# Patient Record
Sex: Female | Born: 1935 | Race: White | Hispanic: No | Smoking: Never smoker
Health system: Southern US, Community
[De-identification: ages and names within clinical notes are randomized; demographics above are authoritative.]

## PROBLEM LIST (undated history)

## (undated) DIAGNOSIS — E785 Hyperlipidemia, unspecified: Secondary | ICD-10-CM

## (undated) HISTORY — PX: HEMORRHOID SURGERY: SHX153

---

## 2020-04-14 ENCOUNTER — Inpatient Hospital Stay (HOSPITAL_COMMUNITY)
Admission: AD | Admit: 2020-04-14 | Discharge: 2020-04-19 | DRG: 668 | Disposition: A | Discharge status: Home Health | Payer: Medicare Other | Source: Other Acute Inpatient Hospital | Attending: Student | Admitting: Student

## 2020-04-14 DIAGNOSIS — Z8249 Family history of ischemic heart disease and other diseases of the circulatory system: Secondary | ICD-10-CM

## 2020-04-14 DIAGNOSIS — R7989 Other specified abnormal findings of blood chemistry: Secondary | ICD-10-CM | POA: Diagnosis not present

## 2020-04-14 DIAGNOSIS — D75839 Thrombocytosis, unspecified: Secondary | ICD-10-CM | POA: Diagnosis not present

## 2020-04-14 DIAGNOSIS — R319 Hematuria, unspecified: Secondary | ICD-10-CM | POA: Diagnosis present

## 2020-04-14 DIAGNOSIS — Z20822 Contact with and (suspected) exposure to covid-19: Secondary | ICD-10-CM | POA: Diagnosis not present

## 2020-04-14 DIAGNOSIS — Z515 Encounter for palliative care: Secondary | ICD-10-CM | POA: Diagnosis not present

## 2020-04-14 DIAGNOSIS — N261 Atrophy of kidney (terminal): Secondary | ICD-10-CM | POA: Diagnosis present

## 2020-04-14 DIAGNOSIS — D72829 Elevated white blood cell count, unspecified: Secondary | ICD-10-CM | POA: Diagnosis present

## 2020-04-14 DIAGNOSIS — R5381 Other malaise: Secondary | ICD-10-CM | POA: Diagnosis not present

## 2020-04-14 DIAGNOSIS — D62 Acute posthemorrhagic anemia: Secondary | ICD-10-CM | POA: Diagnosis present

## 2020-04-14 DIAGNOSIS — N131 Hydronephrosis with ureteral stricture, not elsewhere classified: Secondary | ICD-10-CM | POA: Diagnosis not present

## 2020-04-14 DIAGNOSIS — I5031 Acute diastolic (congestive) heart failure: Secondary | ICD-10-CM | POA: Diagnosis not present

## 2020-04-14 DIAGNOSIS — N179 Acute kidney failure, unspecified: Secondary | ICD-10-CM | POA: Diagnosis present

## 2020-04-14 DIAGNOSIS — R059 Cough, unspecified: Secondary | ICD-10-CM

## 2020-04-14 DIAGNOSIS — N3289 Other specified disorders of bladder: Secondary | ICD-10-CM | POA: Diagnosis present

## 2020-04-14 DIAGNOSIS — E785 Hyperlipidemia, unspecified: Secondary | ICD-10-CM | POA: Diagnosis present

## 2020-04-14 DIAGNOSIS — E876 Hypokalemia: Secondary | ICD-10-CM | POA: Diagnosis not present

## 2020-04-14 DIAGNOSIS — C679 Malignant neoplasm of bladder, unspecified: Principal | ICD-10-CM | POA: Diagnosis present

## 2020-04-14 DIAGNOSIS — R310 Gross hematuria: Secondary | ICD-10-CM

## 2020-04-14 HISTORY — DX: Hyperlipidemia, unspecified: E78.5

## 2020-04-15 ENCOUNTER — Other Ambulatory Visit: Payer: Self-pay

## 2020-04-15 ENCOUNTER — Observation Stay (HOSPITAL_COMMUNITY): Payer: Medicare Other | Admitting: Anesthesiology

## 2020-04-15 ENCOUNTER — Encounter (HOSPITAL_COMMUNITY)
Admission: AD | Disposition: A | Discharge status: Home Health | Payer: Self-pay | Source: Other Acute Inpatient Hospital | Attending: Student

## 2020-04-15 ENCOUNTER — Encounter (HOSPITAL_COMMUNITY): Payer: Self-pay | Admitting: Internal Medicine

## 2020-04-15 ENCOUNTER — Observation Stay (HOSPITAL_COMMUNITY): Payer: Medicare Other

## 2020-04-15 DIAGNOSIS — N3289 Other specified disorders of bladder: Secondary | ICD-10-CM | POA: Diagnosis not present

## 2020-04-15 DIAGNOSIS — I5032 Chronic diastolic (congestive) heart failure: Secondary | ICD-10-CM | POA: Diagnosis not present

## 2020-04-15 DIAGNOSIS — D494 Neoplasm of unspecified behavior of bladder: Secondary | ICD-10-CM | POA: Diagnosis not present

## 2020-04-15 DIAGNOSIS — N131 Hydronephrosis with ureteral stricture, not elsewhere classified: Secondary | ICD-10-CM | POA: Diagnosis present

## 2020-04-15 DIAGNOSIS — D62 Acute posthemorrhagic anemia: Secondary | ICD-10-CM | POA: Diagnosis present

## 2020-04-15 DIAGNOSIS — R531 Weakness: Secondary | ICD-10-CM | POA: Diagnosis not present

## 2020-04-15 DIAGNOSIS — Z20822 Contact with and (suspected) exposure to covid-19: Secondary | ICD-10-CM | POA: Diagnosis present

## 2020-04-15 DIAGNOSIS — D72829 Elevated white blood cell count, unspecified: Secondary | ICD-10-CM | POA: Diagnosis present

## 2020-04-15 DIAGNOSIS — R059 Cough, unspecified: Secondary | ICD-10-CM

## 2020-04-15 DIAGNOSIS — R319 Hematuria, unspecified: Secondary | ICD-10-CM | POA: Diagnosis present

## 2020-04-15 DIAGNOSIS — N261 Atrophy of kidney (terminal): Secondary | ICD-10-CM | POA: Diagnosis present

## 2020-04-15 DIAGNOSIS — Z7189 Other specified counseling: Secondary | ICD-10-CM | POA: Diagnosis not present

## 2020-04-15 DIAGNOSIS — I5023 Acute on chronic systolic (congestive) heart failure: Secondary | ICD-10-CM | POA: Diagnosis not present

## 2020-04-15 DIAGNOSIS — C679 Malignant neoplasm of bladder, unspecified: Secondary | ICD-10-CM | POA: Diagnosis present

## 2020-04-15 DIAGNOSIS — N179 Acute kidney failure, unspecified: Secondary | ICD-10-CM

## 2020-04-15 DIAGNOSIS — R5381 Other malaise: Secondary | ICD-10-CM | POA: Diagnosis present

## 2020-04-15 DIAGNOSIS — D75839 Thrombocytosis, unspecified: Secondary | ICD-10-CM | POA: Diagnosis present

## 2020-04-15 DIAGNOSIS — I5031 Acute diastolic (congestive) heart failure: Secondary | ICD-10-CM | POA: Diagnosis present

## 2020-04-15 DIAGNOSIS — I509 Heart failure, unspecified: Secondary | ICD-10-CM

## 2020-04-15 DIAGNOSIS — R7989 Other specified abnormal findings of blood chemistry: Secondary | ICD-10-CM | POA: Diagnosis present

## 2020-04-15 DIAGNOSIS — E785 Hyperlipidemia, unspecified: Secondary | ICD-10-CM | POA: Diagnosis present

## 2020-04-15 DIAGNOSIS — R310 Gross hematuria: Secondary | ICD-10-CM

## 2020-04-15 DIAGNOSIS — E876 Hypokalemia: Secondary | ICD-10-CM | POA: Diagnosis not present

## 2020-04-15 DIAGNOSIS — Z515 Encounter for palliative care: Secondary | ICD-10-CM | POA: Diagnosis not present

## 2020-04-15 DIAGNOSIS — Z8249 Family history of ischemic heart disease and other diseases of the circulatory system: Secondary | ICD-10-CM | POA: Diagnosis not present

## 2020-04-15 HISTORY — PX: TRANSURETHRAL RESECTION OF BLADDER TUMOR: SHX2575

## 2020-04-15 LAB — BRAIN NATRIURETIC PEPTIDE: B Natriuretic Peptide: 833.2 pg/mL — ABNORMAL HIGH (ref 0.0–100.0)

## 2020-04-15 LAB — COMPREHENSIVE METABOLIC PANEL
ALT: 187 U/L — ABNORMAL HIGH (ref 0–44)
ALT: 171 U/L — ABNORMAL HIGH (ref 0–44)
AST: 214 U/L — ABNORMAL HIGH (ref 15–41)
AST: 170 U/L — ABNORMAL HIGH (ref 15–41)
Albumin: 2.8 g/dL — ABNORMAL LOW (ref 3.5–5.0)
Albumin: 2.8 g/dL — ABNORMAL LOW (ref 3.5–5.0)
Alkaline Phosphatase: 60 U/L (ref 38–126)
Alkaline Phosphatase: 55 U/L (ref 38–126)
Anion gap: 7 (ref 5–15)
Anion gap: 5 (ref 5–15)
BUN: 25 mg/dL — ABNORMAL HIGH (ref 8–23)
BUN: 23 mg/dL (ref 8–23)
CO2: 22 mmol/L (ref 22–32)
CO2: 23 mmol/L (ref 22–32)
Calcium: 8.5 mg/dL — ABNORMAL LOW (ref 8.9–10.3)
Calcium: 8 mg/dL — ABNORMAL LOW (ref 8.9–10.3)
Chloride: 113 mmol/L — ABNORMAL HIGH (ref 98–111)
Chloride: 112 mmol/L — ABNORMAL HIGH (ref 98–111)
Creatinine, Ser: 1.51 mg/dL — ABNORMAL HIGH (ref 0.44–1.00)
Creatinine, Ser: 1.31 mg/dL — ABNORMAL HIGH (ref 0.44–1.00)
GFR, Estimated: 34 mL/min — ABNORMAL LOW (ref >60)
GFR, Estimated: 40 mL/min — ABNORMAL LOW (ref >60)
Glucose, Bld: 120 mg/dL — ABNORMAL HIGH (ref 70–99)
Glucose, Bld: 106 mg/dL — ABNORMAL HIGH (ref 70–99)
Potassium: 4.2 mmol/L (ref 3.5–5.1)
Potassium: 4.2 mmol/L (ref 3.5–5.1)
Sodium: 142 mmol/L (ref 135–145)
Sodium: 140 mmol/L (ref 135–145)
Total Bilirubin: 1.2 mg/dL (ref 0.3–1.2)
Total Bilirubin: 1.1 mg/dL (ref 0.3–1.2)
Total Protein: 5.9 g/dL — ABNORMAL LOW (ref 6.5–8.1)
Total Protein: 5.5 g/dL — ABNORMAL LOW (ref 6.5–8.1)

## 2020-04-15 LAB — PROTIME-INR
INR: 1.2 (ref 0.8–1.2)
Prothrombin Time: 14.6 seconds (ref 11.4–15.2)

## 2020-04-15 LAB — CBC
HCT: 24.4 % — ABNORMAL LOW (ref 36.0–46.0)
HCT: 24.7 % — ABNORMAL LOW (ref 36.0–46.0)
Hemoglobin: 7.5 g/dL — ABNORMAL LOW (ref 12.0–15.0)
Hemoglobin: 7.5 g/dL — ABNORMAL LOW (ref 12.0–15.0)
MCH: 25.8 pg — ABNORMAL LOW (ref 26.0–34.0)
MCH: 25.8 pg — ABNORMAL LOW (ref 26.0–34.0)
MCHC: 30.7 g/dL (ref 30.0–36.0)
MCHC: 30.4 g/dL (ref 30.0–36.0)
MCV: 83.8 fL (ref 80.0–100.0)
MCV: 84.9 fL (ref 80.0–100.0)
Platelets: 519 10*3/uL — ABNORMAL HIGH (ref 150–400)
Platelets: 498 10*3/uL — ABNORMAL HIGH (ref 150–400)
RBC: 2.91 MIL/uL — ABNORMAL LOW (ref 3.87–5.11)
RBC: 2.91 MIL/uL — ABNORMAL LOW (ref 3.87–5.11)
RDW: 17.4 % — ABNORMAL HIGH (ref 11.5–15.5)
RDW: 17.9 % — ABNORMAL HIGH (ref 11.5–15.5)
WBC: 14.4 10*3/uL — ABNORMAL HIGH (ref 4.0–10.5)
WBC: 15.8 10*3/uL — ABNORMAL HIGH (ref 4.0–10.5)
nRBC: 1 % — ABNORMAL HIGH (ref 0.0–0.2)
nRBC: 0.6 % — ABNORMAL HIGH (ref 0.0–0.2)

## 2020-04-15 LAB — MAGNESIUM: Magnesium: 2 mg/dL (ref 1.7–2.4)

## 2020-04-15 LAB — PROCALCITONIN: Procalcitonin: 0.24 ng/mL

## 2020-04-15 LAB — CK: Total CK: 348 U/L — ABNORMAL HIGH (ref 38–234)

## 2020-04-15 SURGERY — TURBT (TRANSURETHRAL RESECTION OF BLADDER TUMOR)
Anesthesia: General

## 2020-04-15 MED ORDER — CHLORHEXIDINE GLUCONATE 0.12 % MT SOLN
15.0000 mL | Freq: Once | OROMUCOSAL | Status: AC
  Administered 2020-04-15: 15 mL via OROMUCOSAL

## 2020-04-15 MED ORDER — DEXAMETHASONE SODIUM PHOSPHATE 10 MG/ML IJ SOLN
INTRAMUSCULAR | Status: DC | PRN
  Administered 2020-04-15: 5 mg via INTRAVENOUS

## 2020-04-15 MED ORDER — ONDANSETRON HCL 4 MG/2ML IJ SOLN
INTRAMUSCULAR | Status: DC | PRN
  Administered 2020-04-15: 4 mg via INTRAVENOUS

## 2020-04-15 MED ORDER — ONDANSETRON HCL 4 MG/2ML IJ SOLN
4.0000 mg | Freq: Four times a day (QID) | INTRAMUSCULAR | Status: DC | PRN
  Administered 2020-04-15 – 2020-04-19 (×2): 4 mg via INTRAVENOUS
  Filled 2020-04-15 (×2): qty 2

## 2020-04-15 MED ORDER — GUAIFENESIN-DM 100-10 MG/5ML PO SYRP
5.0000 mL | ORAL_SOLUTION | ORAL | Status: DC | PRN
  Administered 2020-04-15: 5 mL via ORAL
  Filled 2020-04-15: qty 10

## 2020-04-15 MED ORDER — CEFAZOLIN SODIUM-DEXTROSE 2-3 GM-%(50ML) IV SOLR
INTRAVENOUS | Status: DC | PRN
  Administered 2020-04-15: 2 g via INTRAVENOUS

## 2020-04-15 MED ORDER — ACETAMINOPHEN 650 MG RE SUPP: 650.0000 mg | Freq: Four times a day (QID) | RECTAL | Status: DC | PRN

## 2020-04-15 MED ORDER — IPRATROPIUM-ALBUTEROL 0.5-2.5 (3) MG/3ML IN SOLN: 3.0000 mL | Freq: Four times a day (QID) | RESPIRATORY_TRACT | Status: DC | PRN

## 2020-04-15 MED ORDER — PROPOFOL 10 MG/ML IV BOLUS
INTRAVENOUS | Status: DC | PRN
  Administered 2020-04-15: 60 mg via INTRAVENOUS

## 2020-04-15 MED ORDER — CEFAZOLIN SODIUM-DEXTROSE 2-4 GM/100ML-% IV SOLN
INTRAVENOUS | Status: AC
  Filled 2020-04-15: qty 100

## 2020-04-15 MED ORDER — SUGAMMADEX SODIUM 200 MG/2ML IV SOLN
INTRAVENOUS | Status: DC | PRN
  Administered 2020-04-15: 200 mg via INTRAVENOUS

## 2020-04-15 MED ORDER — ONDANSETRON HCL 4 MG/2ML IJ SOLN: 4.0000 mg | Freq: Once | INTRAMUSCULAR | Status: DC | PRN

## 2020-04-15 MED ORDER — ONDANSETRON HCL 4 MG PO TABS
4.0000 mg | ORAL_TABLET | Freq: Four times a day (QID) | ORAL | Status: DC | PRN
  Administered 2020-04-18: 4 mg via ORAL
  Filled 2020-04-15: qty 1

## 2020-04-15 MED ORDER — PHENYLEPHRINE 40 MCG/ML (10ML) SYRINGE FOR IV PUSH (FOR BLOOD PRESSURE SUPPORT)
PREFILLED_SYRINGE | INTRAVENOUS | Status: DC | PRN
  Administered 2020-04-15: 80 ug via INTRAVENOUS

## 2020-04-15 MED ORDER — PHENYLEPHRINE 40 MCG/ML (10ML) SYRINGE FOR IV PUSH (FOR BLOOD PRESSURE SUPPORT)
PREFILLED_SYRINGE | INTRAVENOUS | Status: AC
  Filled 2020-04-15: qty 20

## 2020-04-15 MED ORDER — ROCURONIUM BROMIDE 10 MG/ML (PF) SYRINGE
PREFILLED_SYRINGE | INTRAVENOUS | Status: DC | PRN
  Administered 2020-04-15: 50 mg via INTRAVENOUS

## 2020-04-15 MED ORDER — SODIUM CHLORIDE 0.9 % IV SOLN: INTRAVENOUS | Status: DC

## 2020-04-15 MED ORDER — ORAL CARE MOUTH RINSE: 15.0000 mL | Freq: Once | OROMUCOSAL | Status: AC

## 2020-04-15 MED ORDER — FENTANYL CITRATE (PF) 100 MCG/2ML IJ SOLN
INTRAMUSCULAR | Status: AC
  Filled 2020-04-15: qty 2

## 2020-04-15 MED ORDER — CHLORHEXIDINE GLUCONATE CLOTH 2 % EX PADS
6.0000 | MEDICATED_PAD | Freq: Every day | CUTANEOUS | Status: DC
  Administered 2020-04-15 – 2020-04-19 (×5): 6 via TOPICAL

## 2020-04-15 MED ORDER — LIDOCAINE 2% (20 MG/ML) 5 ML SYRINGE
INTRAMUSCULAR | Status: DC | PRN
  Administered 2020-04-15: 80 mg via INTRAVENOUS

## 2020-04-15 MED ORDER — FENTANYL CITRATE (PF) 100 MCG/2ML IJ SOLN
25.0000 ug | INTRAMUSCULAR | Status: DC | PRN
  Administered 2020-04-15 (×2): 50 ug via INTRAVENOUS

## 2020-04-15 MED ORDER — ACETAMINOPHEN 325 MG PO TABS
650.0000 mg | ORAL_TABLET | Freq: Four times a day (QID) | ORAL | Status: DC | PRN
  Administered 2020-04-16 – 2020-04-18 (×7): 650 mg via ORAL
  Filled 2020-04-15 (×7): qty 2

## 2020-04-15 MED ORDER — SODIUM CHLORIDE 0.9 % IR SOLN
Status: DC | PRN
  Administered 2020-04-15: 27000 mL

## 2020-04-15 MED ORDER — FENTANYL CITRATE (PF) 100 MCG/2ML IJ SOLN
INTRAMUSCULAR | Status: DC | PRN
  Administered 2020-04-15 (×2): 50 ug via INTRAVENOUS

## 2020-04-15 MED ORDER — LACTATED RINGERS IV SOLN: INTRAVENOUS | Status: DC

## 2020-04-15 SURGICAL SUPPLY — 19 items
BAG URINE DRAIN 2000ML AR STRL (UROLOGICAL SUPPLIES) ×2 IMPLANT
BAG URO CATCHER STRL LF (MISCELLANEOUS) ×2 IMPLANT
CATH FOLEY 3WAY 30CC 24FR (CATHETERS) ×1
CATH URTH STD 24FR FL 3W 2 (CATHETERS) ×1 IMPLANT
DRAPE FOOT SWITCH (DRAPES) ×2 IMPLANT
ELECT REM PT RETURN 15FT ADLT (MISCELLANEOUS) ×2 IMPLANT
EVACUATOR MICROVAS BLADDER (UROLOGICAL SUPPLIES) IMPLANT
GLOVE SURG ENC TEXT LTX SZ7.5 (GLOVE) ×2 IMPLANT
GOWN STRL REUS W/TWL LRG LVL3 (GOWN DISPOSABLE) ×2 IMPLANT
KIT TURNOVER KIT A (KITS) ×2 IMPLANT
LOOP CUT BIPOLAR 24F LRG (ELECTROSURGICAL) ×2 IMPLANT
MANIFOLD NEPTUNE II (INSTRUMENTS) ×2 IMPLANT
PACK CYSTO (CUSTOM PROCEDURE TRAY) ×2 IMPLANT
PENCIL SMOKE EVACUATOR (MISCELLANEOUS) IMPLANT
SYR 30ML LL (SYRINGE) ×2 IMPLANT
SYR TOOMEY IRRIG 70ML (MISCELLANEOUS) ×2
SYRINGE TOOMEY IRRIG 70ML (MISCELLANEOUS) ×1 IMPLANT
TUBING CONNECTING 10 (TUBING) ×2 IMPLANT
TUBING UROLOGY SET (TUBING) ×2 IMPLANT

## 2020-04-15 NOTE — Consult Note (Signed)
Urology Consult   Physician requesting consult: Zada Finders, MD  Reason for consult: Gross hematuria, bladder wall irregularity, severe right hydronephrosis.   History of Present Illness: Sharon Huff is a 87 y.o. with only significant past medical history of hyperlipidemia who presented to [facility] ED on 04/14/2020 generalized weakness, gross hematuria.  Per chart review, upon arrival in the ED, hemoglobin 3.4.  She was transfused 3 units of packed red blood cells with subsequent hemoglobin at 8.4.  A three-way Foley catheter was placed and she was started on continuous bladder irrigation.  A noncontrast CT A/P demonstrated bladder appropriately decompressed around Foley catheter with some posterior bladder wall changes demonstrating possible bladder mass or debris as well as right hydronephrosis and appear to be an atrophic right kidney.  Patient denies prior history of gross hematuria.  She denies smoking history.  She denies taking anticoagulation.  She denies abdominal pain or flank pain.  She denies a personal history of malignancy and denies a family history of urologic malignancy.  She states she has never seen a urologist or a nephrologist prior.  Past Medical History:  Diagnosis Date  . Hyperlipidemia     Past Surgical History:  Procedure Laterality Date  . [facility] Medications:  Home meds:  No current facility-administered medications on file prior to encounter.   Current Outpatient Medications on File Prior to Encounter  Medication Sig Dispense Refill  . busPIRone (BUSPAR) 7.5 MG tablet Take 7.5 mg by mouth daily.    Marland Kitchen lovastatin (MEVACOR) 10 MG tablet Take 10 mg by mouth daily.    . Vitamin D, Ergocalciferol, (DRISDOL) 1.25 MG (50000 UNIT) CAPS capsule Take 50,000 Units by mouth once a week.       Scheduled Meds: Continuous Infusions: . sodium chloride 75 mL/hr at 04/15/20 0145   PRN Meds:.acetaminophen **OR** acetaminophen,  guaiFENesin-dextromethorphan, ondansetron **OR** ondansetron (ZOFRAN) IV  Allergies:  Allergies  Allergen Reactions  . Other Other (See Comments)    Caramel     Family History  Problem Relation Age of Onset  . Heart disease Mother   . Heart disease Father     Social History:  reports that she has never smoked. She has never used smokeless tobacco. She reports that she does not drink alcohol and does not use drugs.  ROS: A complete review of systems was performed.  All systems are negative except for pertinent findings as noted.  Physical Exam:  Vital signs in last 24 hours: Temp:  [98.4 F (36.9 C)] 98.4 F (36.9 C) (03/31 2353) Pulse Rate:  [81] 81 (03/31 2353) Resp:  [21] 21 (03/31 2353) BP: (143)/(66) 143/66 (03/31 2353) SpO2:  [100 %] 100 % (03/31 2353) Constitutional:  Alert and oriented, No acute distress Cardiovascular: Regular rate and rhythm Respiratory: Normal respiratory effort, Lungs clear bilaterally GI: Abdomen is soft, nontender, nondistended, no abdominal masses GU: No CVA tenderness Neurologic: Grossly intact, no focal deficits Psychiatric: Normal mood and affect  Laboratory Data:  No results for input(s): WBC, HGB, HCT, PLT in the last 72 hours.  No results for input(s): NA, K, CL, GLUCOSE, BUN, CALCIUM, CREATININE in the last 72 hours.  Invalid input(s): CO3   No results found for this or any previous visit (from the past 24 hour(s)). No results found for this or any previous visit (from the past 240 hour(s)).  Renal Function: No results for input(s): CREATININE in the last 168 hours. CrCl cannot be calculated (No  successful lab value found.).  Radiologic Imaging: No results found.  I independently reviewed the above imaging studies.  Impression/Recommendation: 1. Gross hematuria: Patient presented to [facility] ED on 04/14/2020 with gross hematuria and initial hemoglobin of 3.4.  She was transfused 3 units packed red blood cells with  subsequent hemoglobin of 8.4.  Three-way Foley catheter was placed and she was started on continuous bladder irrigation.  CT A/P 04/14/2020 with posterior bladder wall irregularity suggesting possible mass versus debris. 2. Acute blood loss anemia: Initially presented with a hemoglobin of 3.4 on 04/14/2020.  S/p 3 units packed red blood cells with resultant hemoglobin of 8.4. 3. Possible bladder mass: CT A/P 04/14/2020 with posterior bladder wall irregularity suggesting possible underlying bladder mass versus debris. 4. Right hydronephrosis: Seen on CT A/P 04/14/2020 with atrophic appearing right kidney.  Unclear if this is a chronic process.  -Continue Foley catheter to gravity.   -I manually irrigated the catheter with approximately 500 mL normal saline with return of no clot.  Urine is very light pink tinge. -Resume continuous bladder irrigation.  Manually irrigate every 4 hours or as needed for clots or decreased drainage. -Follow-up CBC and BMP.  Transfuse if hemoglobin is less than 7. -I did review CT A/P noncontrast study with posterior bladder wall changes.  Unclear if she has an underlying bladder mass.  She will eventually need evaluation with cystoscopy, bilateral retrograde pyelograms, possible bladder biopsy, possible transurethral resection of bladder tumor. -There is no need for urgent evaluation in the operating room tonight as her hemoglobin is stable, she is hemodynamically appropriate and her urine is only very slight pink tinge with irrigation. -If GFR is appropriate, will obtain CT hematuria protocol. -Following  Matt R. Dedee Liss MD 04/15/2020, 1:55 AM  [facility]  Pager: [phone]   CC: Zada Finders, MD

## 2020-04-15 NOTE — Transfer of Care (Signed)
Immediate Anesthesia Transfer of Care Note  Patient: Sharon Huff  Procedure(s) Performed: TRANSURETHRAL RESECTION OF BLADDER TUMOR (TURBT)  (N/A )  Patient Location: PACU  Anesthesia Type:General  Level of Consciousness: sedated, patient cooperative and responds to stimulation  Airway & Oxygen Therapy: Patient Spontanous Breathing and Patient connected to face mask oxygen  Post-op Assessment: Report given to RN and Post -op Vital signs reviewed and stable  Post vital signs: Reviewed and stable  Last Vitals:  Vitals Value Taken Time  BP 151/65 04/15/20 1630  Temp    Pulse 73 04/15/20 1631  Resp 21 04/15/20 1631  SpO2 97 % 04/15/20 1631  Vitals shown include unvalidated device data.  Last Pain:  Vitals:   04/15/20 1300  TempSrc: Oral  PainSc: 0-No pain         Complications: No complications documented.

## 2020-04-15 NOTE — Anesthesia Postprocedure Evaluation (Signed)
Anesthesia Post Note  Patient: Rosia Syme  Procedure(s) Performed: TRANSURETHRAL RESECTION OF BLADDER TUMOR (TURBT)  (N/A )     Patient location during evaluation: PACU Anesthesia Type: General Level of consciousness: awake and alert and oriented Pain management: pain level controlled Vital Signs Assessment: post-procedure vital signs reviewed and stable Respiratory status: spontaneous breathing, nonlabored ventilation and respiratory function stable Cardiovascular status: blood pressure returned to baseline and stable Postop Assessment: no apparent nausea or vomiting Anesthetic complications: no   No complications documented.  Last Vitals:  Vitals:   04/15/20 1700 04/15/20 1715  BP: (!) 174/66 (!) 141/79  Pulse: 79 69  Resp: 20 15  Temp:  36.8 C  SpO2: 99% 100%    Last Pain:  Vitals:   04/15/20 1715  TempSrc:   PainSc: 0-No pain                 Terrance Lanahan A.

## 2020-04-15 NOTE — Anesthesia Preprocedure Evaluation (Addendum)
Anesthesia Evaluation  Patient identified by MRN, date of birth, ID bandGeneral Assessment Comment:Patient very tired. Mild tachypnea  Reviewed: Allergy & Precautions, NPO status , Patient's Chart, lab work & pertinent test results  Airway Mallampati: II  TM Distance: >3 FB Neck ROM: Full    Dental no notable dental hx.    Pulmonary neg pulmonary ROS,    Pulmonary exam normal breath sounds clear to auscultation       Cardiovascular negative cardio ROS Normal cardiovascular exam Rhythm:Regular Rate:Normal     Neuro/Psych negative neurological ROS  negative psych ROS   GI/Hepatic negative GI ROS, Neg liver ROS,   Endo/Other  negative endocrine ROS  Renal/GU New bladder mass with anemia, and R hydronephritis  negative genitourinary   Musculoskeletal negative musculoskeletal ROS (+)   Abdominal   Peds negative pediatric ROS (+)  Hematology  (+) anemia ,   Anesthesia Other Findings   Reproductive/Obstetrics negative OB ROS                            Anesthesia Physical Anesthesia Plan  ASA: III  Anesthesia Plan: General   Post-op Pain Management:    Induction: Intravenous  PONV Risk Score and Plan: 3 and Ondansetron, Dexamethasone and Treatment may vary due to age or medical condition  Airway Management Planned: LMA  Additional Equipment:   Intra-op Plan:   Post-operative Plan: Extubation in OR  Informed Consent: I have reviewed the patients History and Physical, chart, labs and discussed the procedure including the risks, benefits and alternatives for the proposed anesthesia with the patient or authorized representative who has indicated his/her understanding and acceptance.     Dental advisory given  Plan Discussed with: CRNA and Surgeon  Anesthesia Plan Comments:         Anesthesia Quick Evaluation

## 2020-04-15 NOTE — Progress Notes (Signed)
PROGRESS NOTE  Sharon Huff ZOX:[id number] DOB: 1934-01-02   PCP: System, Provider Not In  Patient is from: Home  DOA: 04/14/2020 LOS: 1  Chief complaints: Hematuria and anemia  Brief Narrative / Interim history: 87 year old F with no significant PMH other than hyperlipidemia presented to [facility] ED with generalized weakness and hematuria, and found to have Hgb of 3.4 in the setting of gross hematuria.  CT abdomen and pelvis showed posterior wall bladder changes concerning for possible mass versus debris's as well as severe right hydronephrosis.  Transfused 3 units and Hgb improved to 8.4, and transferred here for further evaluation and treatment.  Urology following.  Subjective: Seen and examined earlier this morning.  No major events overnight or this morning.  Remains on continuous bladder irrigation.  Output light pinkish.  Reports productive cough with whitish phlegm.  She denies shortness of breath, chest pain, nausea, vomiting or abdominal pain.  Denies fever or chills.  She denies history of lung disease, heart disease or kidney disease.  Never a smoker.  Objective: Vitals:   04/14/20 2353 04/15/20 0354 04/15/20 0754  BP: (!) 143/66 132/63 (!) 138/55  Pulse: 81 85 85  Resp: (!) 21 (!) 22 20  Temp: 98.4 F (36.9 C) 98.7 F (37.1 C) 98.6 F (37 C)  TempSrc: Oral  Oral  SpO2: 100% 99% 100%    Intake/Output Summary (Last 24 hours) at 04/15/2020 1034 Last data filed at 04/15/2020 0700 Gross per 24 hour  Intake 3665.63 ml  Output 6675 ml  Net -3009.37 ml   There were no vitals filed for this visit.  Examination:  GENERAL: No apparent distress.  Nontoxic. HEENT: MMM.  Vision and hearing grossly intact.  NECK: Supple.  No apparent JVD.  RESP: 100% on 2 L.  No IWOB.  Upper airway sound.  CVS:  RRR. Heart sounds normal.  ABD/GI/GU: BS+. Abd full but soft.  NTND.  MSK/EXT:  Moves extremities. No apparent deformity. No edema.  SKIN: no apparent skin lesion or  wound NEURO: Awake, alert and oriented appropriately.  No apparent focal neuro deficit. PSYCH: Calm. Normal affect.  Procedures:  Continuous bladder irrigation  Microbiology summarized: [id number] PCR pending Urine culture pending  Assessment & Plan: ABLA due to gross hematuria: Hgb 3.4 at OSH>3u>8.4> 7.5.  Unknown baseline. Posterior bladder mass with moderate right hydronephrosis Recent Labs    04/15/20 0319 04/15/20 0906  HGB 7.5* 7.5*  -H&H stable at 7.5.  Transfuse for Hgb<7.0. -Urology following-plan for cystoscopy, pyelogram, possible bladder biopsy and possible TURBT  Acute CHF: unknown type. Reports productive cough with whitish phlegm.  Denies dyspnea.  Saturating 100% but on 2 L.  Denies history of lung or heart disease. Upper airway sound on exam.  CXR with cardiomegaly and vascular congestion.  Does not appear fluid overloaded on exam. -Follow BNP -Stopped IV fluid for now -Hold off IV Lasix in the setting of AKI.  No respiratory distress.  Clinically appears euvolemic. -Check echocardiogram -As needed DuoNeb  AKI/azotemia: Unclear baseline.  Due to obstructive uropathy.  Renal US with somewhat atrophic right kidney and moderate hydronephrosis but normal echogenicity Recent Labs    04/15/20 0319  BUN 25*  CREATININE 1.51*  -Recheck renal function -Avoid nephrotoxic meds   Elevated LFT: Pattern consistent with EtOH or rhabdo.  CK mildly elevated to 348.  Congestive hepatopathy?  No GI symptoms. -Check acute hepatitis panel -Continue monitoring  Leukocytosis/thrombocytosis-reports productive cough, and CT with concern for bladder mass/debris's -Check urinalysis and urine  culture -Follow CXR -Check procalcitonin  Debility/generalized weakness -PT/OT once she is off continuous bladder irrigation.   There is no height or weight on file to calculate BMI.         DVT prophylaxis:  SCDs Start: 04/15/20 0040  Code Status: Full code Family Communication:  Updated patient's son over the phone. Level of care: Med-Surg Status is: Observation  The patient will require care spanning > 2 midnights and should be moved to inpatient because: Ongoing diagnostic testing needed not appropriate for outpatient work up and Inpatient level of care appropriate due to severity of illness  Dispo: The patient is from: Home              Anticipated d/c is to: Home              Patient currently is not medically stable to d/c.   Difficult to place patient No       Consultants:  Urology   Sch Meds:  Scheduled Meds: Continuous Infusions: PRN Meds:.acetaminophen **OR** acetaminophen, guaiFENesin-dextromethorphan, ipratropium-albuterol, ondansetron **OR** ondansetron (ZOFRAN) IV  Antimicrobials: Anti-infectives (From admission, onward)   None       I have personally reviewed the following labs and images: CBC: Recent Labs  Lab 04/15/20 0319 04/15/20 0906  WBC 14.4* 15.8*  HGB 7.5* 7.5*  HCT 24.4* 24.7*  MCV 83.8 84.9  PLT 519* 498*   BMP &GFR Recent Labs  Lab 04/15/20 0319  NA 142  K 4.2  CL 113*  CO2 22  GLUCOSE 120*  BUN 25*  CREATININE 1.51*  CALCIUM 8.5*   CrCl cannot be calculated (Unknown ideal weight.). Liver & Pancreas: Recent Labs  Lab 04/15/20 0319  AST 214*  ALT 187*  ALKPHOS 60  BILITOT 1.2  PROT 5.9*  ALBUMIN 2.8*   No results for input(s): LIPASE, AMYLASE in the last 168 hours. No results for input(s): AMMONIA in the last 168 hours. Diabetic: No results for input(s): HGBA1C in the last 72 hours. No results for input(s): GLUCAP in the last 168 hours. Cardiac Enzymes: Recent Labs  Lab 04/15/20 0319  CKTOTAL 348*   No results for input(s): PROBNP in the last 8760 hours. Coagulation Profile: Recent Labs  Lab 04/15/20 0319  INR 1.2   Thyroid Function Tests: No results for input(s): TSH, T4TOTAL, FREET4, T3FREE, THYROIDAB in the last 72 hours. Lipid Profile: No results for input(s): CHOL, HDL,  LDLCALC, TRIG, CHOLHDL, LDLDIRECT in the last 72 hours. Anemia Panel: No results for input(s): VITAMINB12, FOLATE, FERRITIN, TIBC, IRON, RETICCTPCT in the last 72 hours. Urine analysis: No results found for: COLORURINE, APPEARANCEUR, LABSPEC, PHURINE, GLUCOSEU, HGBUR, BILIRUBINUR, KETONESUR, PROTEINUR, UROBILINOGEN, NITRITE, LEUKOCYTESUR Sepsis Labs: Invalid input(s): PROCALCITONIN, [place]  Microbiology: No results found for this or any previous visit (from the past 240 hour(s)).  Radiology Studies: US RENAL  Result Date: 04/15/2020 CLINICAL DATA:  Gross hematuria EXAM: RENAL / URINARY TRACT ULTRASOUND COMPLETE COMPARISON:  None. FINDINGS: Right Kidney: Renal measurements: 8.8 x 4.1 x 6.8 cm = volume: 128 mL. Echogenicity within normal limits. No mass. Moderate hydronephrosis. Left Kidney: Renal measurements: 10.0 x 4.6 x 4.8 = volume: 117 mL. Echogenicity within normal limits. No mass or hydronephrosis visualized. Bladder: The collapsed around Foley catheter. Other: None IMPRESSION: Moderate right hydronephrosis indicative of ureteral obstruction which may be due to stone, stricture, or mass. Electronically Signed   By: Miachel Roux M.D.   On: 04/15/2020 07:59   DG Chest Port 1 View  Result Date: 04/15/2020 CLINICAL  DATA:  Cough, weakness, lethargy EXAM: PORTABLE CHEST 1 VIEW COMPARISON:  None. FINDINGS: Cardiomegaly with mild vascular congestion and nonspecific slight interstitial prominence. Minor basilar atelectasis. No large effusion or pneumothorax. Bones are osteopenic. Atherosclerosis of the aorta. Scoliosis of the spine noted. IMPRESSION: Cardiomegaly with vascular congestion and basilar atelectasis. Electronically Signed   By: Jerilynn Mages.  Shick M.D.   On: 04/15/2020 10:20     Kessa Fairbairn T. [place]  If 7PM-7AM, please contact night-coverage [email or web address] 04/15/2020, 10:34 AM

## 2020-04-15 NOTE — Progress Notes (Signed)
  Subjective: Denies pain, no nausea or emesis.   Objective: Vital signs in last 24 hours: Temp:  [98.4 F (36.9 C)-98.7 F (37.1 C)] 98.6 F (37 C) (04/01 0754) Pulse Rate:  [81-85] 85 (04/01 0754) Resp:  [20-22] 20 (04/01 0754) BP: (132-143)/(55-66) 138/55 (04/01 0754) SpO2:  [99 %-100 %] 100 % (04/01 0754)  Intake/Output from previous day: 03/31 0701 - 04/01 0700 In: 3665.6 [I.V.:165.6] Out: 6675 [Urine:6675] Intake/Output this shift: No intake/output data recorded.  Physical Exam:  General: Alert and oriented CV: RRR Lungs: Clear Abdomen: Soft, ND, NT Ext: NT, No erythema  Lab Results: Recent Labs    04/15/20 0319 04/15/20 0906  HGB 7.5* 7.5*  HCT 24.4* 24.7*   BMET Recent Labs    04/15/20 0319  NA 142  K 4.2  CL 113*  CO2 22  GLUCOSE 120*  BUN 25*  CREATININE 1.51*  CALCIUM 8.5*     Studies/Results: US RENAL  Result Date: 04/15/2020 CLINICAL DATA:  Gross hematuria EXAM: RENAL / URINARY TRACT ULTRASOUND COMPLETE COMPARISON:  None. FINDINGS: Right Kidney: Renal measurements: 8.8 x 4.1 x 6.8 cm = volume: 128 mL. Echogenicity within normal limits. No mass. Moderate hydronephrosis. Left Kidney: Renal measurements: 10.0 x 4.6 x 4.8 = volume: 117 mL. Echogenicity within normal limits. No mass or hydronephrosis visualized. Bladder: The collapsed around Foley catheter. Other: None IMPRESSION: Moderate right hydronephrosis indicative of ureteral obstruction which may be due to stone, stricture, or mass. Electronically Signed   By: Miachel Roux M.D.   On: 04/15/2020 07:59   DG Chest Port 1 View  Result Date: 04/15/2020 CLINICAL DATA:  Cough, weakness, lethargy EXAM: PORTABLE CHEST 1 VIEW COMPARISON:  None. FINDINGS: Cardiomegaly with mild vascular congestion and nonspecific slight interstitial prominence. Minor basilar atelectasis. No large effusion or pneumothorax. Bones are osteopenic. Atherosclerosis of the aorta. Scoliosis of the spine noted. IMPRESSION:  Cardiomegaly with vascular congestion and basilar atelectasis. Electronically Signed   By: Jerilynn Mages.  Shick M.D.   On: 04/15/2020 10:20    Assessment/Plan: 1. Gross hematuria: Patient presented to [facility] ED on 04/14/2020 with gross hematuria and initial hemoglobin of 3.4.  She was transfused 3 units packed red blood cells with subsequent hemoglobin of 8.4.  Three-way Foley catheter was placed and she was started on continuous bladder irrigation.  CT A/P 04/14/2020 with posterior bladder wall irregularity suggesting possible mass versus debris. 2. Acute blood loss anemia: Initially presented with a hemoglobin of 3.4 on 04/14/2020.  S/p 3 units packed red blood cells with resultant hemoglobin of 8.4. 3. Possible bladder mass: CT A/P 04/14/2020 with posterior bladder wall irregularity suggesting possible underlying bladder mass versus debris. 4. Right hydronephrosis: Seen on CT A/P 04/14/2020 with atrophic appearing right kidney.  Unclear if this is a chronic process.  -Urine is clear on CBI -Persistent right hydronephrosis seen on renal u/s 04/15/2020 -To OR for cysto, possible bladder biopsy, possible TURBT, R RPG, Right stent placement, possible R URS with biopsy --The risks, benefits and alternatives of above procedure was discussed with the patient.  Risks include, but are not limited to: bleeding, urinary tract infection, ureteral injury, ureteral stricture disease, chronic pain, urinary symptoms, bladder injury, stent migration, the need for nephrostomy tube placement, MI, CVA, DVT, PE and the inherent risks with general anesthesia.  The patient voices understanding and wishes to proceed.     LOS: 1 day   Matt R. Neythan Kozlov MD 04/15/2020, 11:26 AM [facility]  Pager: [phone]

## 2020-04-15 NOTE — Op Note (Signed)
Operative Note  Preoperative diagnosis:  1.  Gross hematuria 2. Right hydronephrosis  Postoperative diagnosis: 1.  Bladder mass: At least T3, possibly T4. 2. Right hydronephrosis  Procedure(s): 1.  TURBT large 2. Exam under anesthesia  Surgeon: Rexene Alberts, MD  Assistants:  None  Anesthesia:  General  Complications:  None  EBL:  80ml  Specimens: 1.  ID Type Source Tests Collected by Time Destination  1 : Bladder Tumor Tissue PATH GU tumor resection SURGICAL PATHOLOGY Janith Lima, MD 04/15/2020 1515   2 : Right lateral deep resection bladder tumor Tissue PATH GU tumor resection SURGICAL PATHOLOGY Janith Lima, MD 04/15/2020 1526   3 : Necrotic bladder mass Tissue PATH GU tumor resection SURGICAL PATHOLOGY Janith Lima, MD 04/15/2020 1603     Drains/Catheters: 1.  24 Fr 3 way foley catheter  Intraoperative findings:   1.  Exam under anesthesia prior to resection demonstrated a firm immobile bladder mass palpable at the expected location of the trigone. 2. Upon entrance into the bladder, there is a very large bladder mass encompassing nearly 75% of the entire bladder.  It is present on the trigone extending towards the right lateral wall, posterior bladder wall, left bladder wall, posterior wall.  There is also a large free-floating well-formed clot present. 3.  It was immediately apparent that this would be impossible to resect in its entirety without significant risk of perforation or bleeding.  I focused my efforts on resecting the right side of the trigone towards right lateral wall as there was high right-sided hydronephrosis on preoperative CT and renal ultrasound.  This was resected to the level of the muscle with persistent tumor still present.  Excellent hemostasis was obtained.  I did leave the majority of the tumor unresectable. 4.  I was unable to locate either ureteral orifice given the large mass present.  Number of tumors: Diffuse Size of largest tumor:        Greater than 10cm total  Characteristics of tumors:     Papillary  Yes    Recurrent   No    Primary   Yes  Suspicious for Carcinoma in situ:   No  Clinical tumor stage:        At least cT3  (right hydronephrosis, firm immobile mass on exam)  Bimanual exam under anesthesia:      Firm immobile mass present at expected location of trigone prior to and after resection  Visually complete resection:                No - I resected approximately 1/3 of total tumor burden  Visualization of detrusor muscle in resection base:    Yes  Visual evaluation for perforation:         No evidence of perforation   Indication:  Sharon Huff is a 87 y.o. female who presented to [facility] ED on 04/14/2020 with gross hematuria and acute blood loss anemia with presenting hemoglobin of 3.4.  She was transfused 3 units packed red blood cells with resultant hemoglobin 8.4.  CT A/P 04/15/2018 2218 at [facility] outside hospital demonstrated posterior bladder wall irregularity suggesting possible bladder mass versus debris.  This also demonstrated severe right hydronephrosis.  Given her gross hematuria and possible mass, she is being taken back to the operating today for cystoscopy, examined procedure, possible transurethral section of bladder tumor, possible right ureteral stent placement. All the risks, benefits were discussed with the patient to include but not limited to infection, pain, bleeding,  damage to adjacent structures, need for further operations, adverse reaction to anesthesia and death.  Patient understands these risks and agrees to proceed with the operation as planned.    Description of procedure: After informed consent was obtained from the patient, the patient was taken to the operating room. General anesthesia was administered. The patient was placed in dorsal lithotomy position and prepped and draped in usual sterile fashion. Sequential compression devices were applied to lower extremities at the beginning  of the case for DVT prophylaxis. Antibiotics were infused prior to surgery start time. A surgical time-out was performed to properly identify the patient, the surgery to be performed, and the surgical site.    I performed initial exam under anesthesia which demonstrated firm and immobile mass at the expected location of the trigone.  We then passed the 21-French rigid cystoscope down the urethra and into the bladder under direct vision without any difficulty and immediately apparent was a diffuse papillary high-grade appearing bladder mass extending from the right lateral wall, trigone, posterior wall, left lateral wall.  It was merely apparent that this would be unresectable and doing so would almost certainly cause perforation given likely extension of a clinical T3/T4 disease.  I focused my efforts on resecting the right side of the trigone and attempt to visualize the right ureteral orifice.  I was unable to visualize either ureteral orifice prior to or after the resection.   We then removed the cystoscope and then passed down the 26 French resectoscope sheath down the urethra into the bladder under direct vision with the visual obturator. The tumor was resected down to muscle on the right side of the trigone.  Muscle was seen however tumor was still present.  I resected for approximately 1 hour and 45 minutes.  I elected not to resect further as I did not want to cause a perforation in this frail elderly patient.   The TUR bladder tumor chips were retrieved from the bladder and each region of resection was passed off the field as a separate specimen.  Hemostasis was achieved using electrocautery. We then proceeded with removing the resectoscope and then placed in a 24 French 3 way Foley catheter, instilled 77ml into the balloon and started the continuous bladder irrigation system.  The patient tolerated the procedure well with no complication and was awoken from anesthesia and taken to recovery in  stable condition.    Plan: Continuous bladder irrigation overnight.  We will likely be able to clamp tomorrow.  Trend hemoglobin.  Transfuse if less than 7.  Follow-up pathology.  She has at least clinical stage T3 possibly T4 disease with a firm immobile mass palpated at the expected location of her trigone.  Unfortunately, she is not a cystectomy candidate given her elderly age and comorbidities.  We will further discuss options including palliative care or concurrent chemoradiotherapy.  We will plan for right percutaneous nephrostomy tube placement given severe hydronephrosis and unable to identify the right ureteral orifice.  I will plan to discuss surgical plans with the patient prior to these next steps.  Matt R. [facility]  Pager: [phone]

## 2020-04-15 NOTE — H&P (Signed)
History and Physical    Sharon Huff HLK:[id number] DOB: 08-21-1933 DOA: 04/14/2020  PCP: System, Provider Not In  Patient coming from: [facility]  I have personally briefly reviewed patient's old medical records in [place]  Chief Complaint: Hematuria, anemia  HPI: Sharon Huff is a 87 y.o. female with medical history significant for hyperlipidemia who presented to [facility] ED for evaluation of generalized weakness and bloody urine.  Patient states she has been feeling generally weak over the last 2 days.  She lives alone and her son wanted to check in on her morning of 3/31.  They noticed that her bedsheets were bloody.  They went to the ED where she was found to have frank hematuria.  Hemoglobin was 3.4.  She was transfused 3 units PRBCs with subsequent hemoglobin improved to 8.4.  CT abdomen/pelvis showed posterior wall bladder changes concerning for possible mass vs debris as well as severe right hydronephrosis.  The ED physician discussed with on-call urology here who agreed to consult if patient transferred.  The hospitalist service was consulted to admit.  Prior to transfer, patient was started on continuous bladder irrigation.  Patient denies any other obvious bleeding.  She does not take any blood thinners.  She says the only medications she is currently taking her lovastatin and buspirone.  She is a never smoker, denies any alcohol use.  She reports a history of heart disease in her mother and father.  She denies any chest pain, dyspnea, abdominal pain.  Review of Systems: All systems reviewed and are negative except as documented in history of present illness above.   Past Medical History:  Diagnosis Date  . Hyperlipidemia     Past Surgical History:  Procedure Laterality Date  . HEMORRHOID SURGERY      Social History:  reports that she has never smoked. She has never used smokeless tobacco. She reports that she does not drink alcohol and does  not use drugs.  Allergies  Allergen Reactions  . Other Other (See Comments)    Caramel     Family History  Problem Relation Age of Onset  . Heart disease Mother   . Heart disease Father      Prior to Admission medications   Not on File    Physical Exam: Vitals:   04/14/20 2353  BP: (!) 143/66  Pulse: 81  Resp: (!) 21  Temp: 98.4 F (36.9 C)  TempSrc: Oral  SpO2: 100%   Constitutional: Elderly woman resting supine in bed, NAD, calm, comfortable Eyes: PERRL, lids and conjunctivae normal ENMT: Mucous membranes are moist. Posterior pharynx clear of any exudate or lesions.Normal dentition.  Neck: normal, supple, no masses. Respiratory: clear to auscultation bilaterally, no wheezing, no crackles. Normal respiratory effort. No accessory muscle use.  Cardiovascular: Regular rate and rhythm, no murmurs / rubs / gallops. No extremity edema. 2+ pedal pulses. Abdomen: no tenderness, no masses palpated. Bowel sounds positive.  GU: Foley in place, bloody urine in collecting bag Musculoskeletal: no clubbing / cyanosis. No joint deformity upper and lower extremities. Good ROM, no contractures. Normal muscle tone.  Skin: no rashes, lesions, ulcers. No induration Neurologic: CN 2-12 grossly intact. Sensation intact. Strength 5/5 in all 4.  Psychiatric: Normal judgment and insight. Alert and oriented x 3. Normal mood.   Labs on Admission: I have personally reviewed following labs and imaging studies  CBC: No results for input(s): WBC, NEUTROABS, HGB, HCT, MCV, PLT in the last 168 hours. Basic Metabolic Panel:  No results for input(s): NA, K, CL, CO2, GLUCOSE, BUN, CREATININE, CALCIUM, MG, PHOS in the last 168 hours. GFR: CrCl cannot be calculated (No successful lab value found.). Liver Function Tests: No results for input(s): AST, ALT, ALKPHOS, BILITOT, PROT, ALBUMIN in the last 168 hours. No results for input(s): LIPASE, AMYLASE in the last 168 hours. No results for input(s):  AMMONIA in the last 168 hours. Coagulation Profile: No results for input(s): INR, PROTIME in the last 168 hours. Cardiac Enzymes: No results for input(s): CKTOTAL, CKMB, CKMBINDEX, TROPONINI in the last 168 hours. BNP (last 3 results) No results for input(s): PROBNP in the last 8760 hours. HbA1C: No results for input(s): HGBA1C in the last 72 hours. CBG: No results for input(s): GLUCAP in the last 168 hours. Lipid Profile: No results for input(s): CHOL, HDL, LDLCALC, TRIG, CHOLHDL, LDLDIRECT in the last 72 hours. Thyroid Function Tests: No results for input(s): TSH, T4TOTAL, FREET4, T3FREE, THYROIDAB in the last 72 hours. Anemia Panel: No results for input(s): VITAMINB12, FOLATE, FERRITIN, TIBC, IRON, RETICCTPCT in the last 72 hours. Urine analysis: No results found for: COLORURINE, APPEARANCEUR, LABSPEC, PHURINE, GLUCOSEU, HGBUR, BILIRUBINUR, KETONESUR, PROTEINUR, UROBILINOGEN, NITRITE, LEUKOCYTESUR  Radiological Exams on Admission: No results found.  EKG: Not performed.  Assessment/Plan Principal Problem:   Acute blood loss anemia Active Problems:   Hematuria   Sharon Huff is a 87 y.o. female with medical history significant for hyperlipidemia who is admitted with a BLA due to hematuria with possible posterior bladder wall mass.  Acute blood loss anemia due to hematuria with potential posterior bladder wall mass: Hemoglobin 3.4 at outside hospital, s/p 3 unit PRBC transfusion with repeat hemoglobin 8.4.  CT A/P with possible posterior bladder wall mass/debris and severe right hydronephrosis.  Started on continuous bladder wall irrigation prior to transfer here. -Urology aware and to see -Repeat CBC, transfuse with PRBC further if hemoglobin <7.0 -Continue IV fluid hydration -Avoid blood thinners  DVT prophylaxis: SCDs Code Status: Full code, confirmed with patient Family Communication: Discussed with patient, she has discussed with family Disposition Plan: From home  and likely discharge to home pending further urology evaluation/management Consults called: Urology Level of care: Med-Surg Admission status:  Status is: Inpatient  Remains inpatient appropriate because:Ongoing diagnostic testing needed not appropriate for outpatient work up   Dispo: The patient is from: Home              Anticipated d/c is to: Home              Patient currently is not medically stable to d/c.   Difficult to place patient No   Zada Finders MD [facility]  If 7PM-7AM, please contact night-coverage [email or web address]  04/15/2020, 1:37 AM

## 2020-04-15 NOTE — Plan of Care (Signed)

## 2020-04-15 NOTE — Progress Notes (Signed)
This RN tried to call patient's son Braulio Conte to notify patient's arrival but unsuccessful.

## 2020-04-15 NOTE — Anesthesia Procedure Notes (Signed)
Procedure Name: Intubation °Performed by: Timtohy Broski J, CRNA °Pre-anesthesia Checklist: Patient identified, Emergency Drugs available, Suction available, Patient being monitored and Timeout performed °Patient Re-evaluated:Patient Re-evaluated prior to induction °Oxygen Delivery Method: Circle system utilized °Preoxygenation: Pre-oxygenation with 100% oxygen °Induction Type: IV induction °Ventilation: Mask ventilation without difficulty °Laryngoscope Size: Mac and 4 °Grade View: Grade I °Tube size: 7.0 mm °Number of attempts: 1 °Airway Equipment and Method: Stylet °Placement Confirmation: ETT inserted through vocal cords under direct vision,  positive ETCO2 and breath sounds checked- equal and bilateral °Secured at: 21 cm °Tube secured with: Tape °Dental Injury: Teeth and Oropharynx as per pre-operative assessment  ° ° ° ° ° ° °

## 2020-04-16 ENCOUNTER — Encounter (HOSPITAL_COMMUNITY): Payer: Self-pay | Admitting: Urology

## 2020-04-16 ENCOUNTER — Inpatient Hospital Stay (HOSPITAL_COMMUNITY): Payer: Medicare Other

## 2020-04-16 DIAGNOSIS — I5023 Acute on chronic systolic (congestive) heart failure: Secondary | ICD-10-CM

## 2020-04-16 DIAGNOSIS — N3289 Other specified disorders of bladder: Secondary | ICD-10-CM | POA: Diagnosis not present

## 2020-04-16 DIAGNOSIS — R7989 Other specified abnormal findings of blood chemistry: Secondary | ICD-10-CM

## 2020-04-16 DIAGNOSIS — R748 Abnormal levels of other serum enzymes: Secondary | ICD-10-CM

## 2020-04-16 DIAGNOSIS — D62 Acute posthemorrhagic anemia: Secondary | ICD-10-CM | POA: Diagnosis not present

## 2020-04-16 DIAGNOSIS — I509 Heart failure, unspecified: Secondary | ICD-10-CM | POA: Diagnosis not present

## 2020-04-16 DIAGNOSIS — D72825 Bandemia: Secondary | ICD-10-CM

## 2020-04-16 DIAGNOSIS — R310 Gross hematuria: Secondary | ICD-10-CM | POA: Diagnosis not present

## 2020-04-16 LAB — COMPREHENSIVE METABOLIC PANEL
ALT: 119 U/L — ABNORMAL HIGH (ref 0–44)
AST: 88 U/L — ABNORMAL HIGH (ref 15–41)
Albumin: 2.4 g/dL — ABNORMAL LOW (ref 3.5–5.0)
Alkaline Phosphatase: 58 U/L (ref 38–126)
Anion gap: 6 (ref 5–15)
BUN: 25 mg/dL — ABNORMAL HIGH (ref 8–23)
CO2: 23 mmol/L (ref 22–32)
Calcium: 8.1 mg/dL — ABNORMAL LOW (ref 8.9–10.3)
Chloride: 109 mmol/L (ref 98–111)
Creatinine, Ser: 1.33 mg/dL — ABNORMAL HIGH (ref 0.44–1.00)
GFR, Estimated: 39 mL/min — ABNORMAL LOW (ref >60)
Glucose, Bld: 139 mg/dL — ABNORMAL HIGH (ref 70–99)
Potassium: 4.6 mmol/L (ref 3.5–5.1)
Sodium: 138 mmol/L (ref 135–145)
Total Bilirubin: 0.7 mg/dL (ref 0.3–1.2)
Total Protein: 5.1 g/dL — ABNORMAL LOW (ref 6.5–8.1)

## 2020-04-16 LAB — CBC WITH DIFFERENTIAL/PLATELET
Abs Immature Granulocytes: 0.22 10*3/uL — ABNORMAL HIGH (ref 0.00–0.07)
Basophils Absolute: 0 10*3/uL (ref 0.0–0.1)
Basophils Relative: 0 %
Eosinophils Absolute: 0 10*3/uL (ref 0.0–0.5)
Eosinophils Relative: 0 %
HCT: 22.8 % — ABNORMAL LOW (ref 36.0–46.0)
Hemoglobin: 6.8 g/dL — CL (ref 12.0–15.0)
Immature Granulocytes: 2 %
Lymphocytes Relative: 4 %
Lymphs Abs: 0.5 10*3/uL — ABNORMAL LOW (ref 0.7–4.0)
MCH: 25.9 pg — ABNORMAL LOW (ref 26.0–34.0)
MCHC: 29.8 g/dL — ABNORMAL LOW (ref 30.0–36.0)
MCV: 86.7 fL (ref 80.0–100.0)
Monocytes Absolute: 0.8 10*3/uL (ref 0.1–1.0)
Monocytes Relative: 6 %
Neutro Abs: 12.9 10*3/uL — ABNORMAL HIGH (ref 1.7–7.7)
Neutrophils Relative %: 88 %
Platelets: 422 10*3/uL — ABNORMAL HIGH (ref 150–400)
RBC: 2.63 MIL/uL — ABNORMAL LOW (ref 3.87–5.11)
RDW: 18.2 % — ABNORMAL HIGH (ref 11.5–15.5)
WBC: 14.6 10*3/uL — ABNORMAL HIGH (ref 4.0–10.5)
nRBC: 0.1 % (ref 0.0–0.2)

## 2020-04-16 LAB — ECHOCARDIOGRAM COMPLETE
Area-P 1/2: 2.95 cm2
Height: 62 in
S' Lateral: 2.5 cm
Weight: 2123.47 oz

## 2020-04-16 LAB — ABO/RH: ABO/RH(D): O POS

## 2020-04-16 LAB — MAGNESIUM: Magnesium: 1.9 mg/dL (ref 1.7–2.4)

## 2020-04-16 LAB — PREPARE RBC (CROSSMATCH)

## 2020-04-16 LAB — PROCALCITONIN: Procalcitonin: 0.12 ng/mL

## 2020-04-16 LAB — HEMOGLOBIN AND HEMATOCRIT, BLOOD
HCT: 27.4 % — ABNORMAL LOW (ref 36.0–46.0)
Hemoglobin: 8.5 g/dL — ABNORMAL LOW (ref 12.0–15.0)

## 2020-04-16 MED ORDER — SODIUM CHLORIDE 0.9% IV SOLUTION: Freq: Once | INTRAVENOUS | Status: AC

## 2020-04-16 MED ORDER — SODIUM CHLORIDE 0.9 % IR SOLN
3000.0000 mL | Status: DC
  Administered 2020-04-16: 3000 mL

## 2020-04-16 NOTE — Progress Notes (Signed)
Blood Transfusion  Consent obtained and placed to patients chart front. [place] made aware. Day shift RN updated.

## 2020-04-16 NOTE — Plan of Care (Signed)

## 2020-04-16 NOTE — Progress Notes (Signed)
  Echocardiogram 2D Echocardiogram has been performed.  Randa Lynn Witney Huie 04/16/2020, 9:47 AM

## 2020-04-16 NOTE — Progress Notes (Addendum)
1 Day Post-Op Subjective: Denies right flank pain. Tolerating diet without nausea/emesis. Foley in place draining light pink urine on moderate drip CBI; CBI decreased to slow drip this morning   Objective: Vital signs in last 24 hours: Temp:  [97.3 F (36.3 C)-100 F (37.8 C)] 98.8 F (37.1 C) (04/02 0829) Pulse Rate:  [52-92] 83 (04/02 0829) Resp:  [10-21] 18 (04/02 0829) BP: (131-177)/(58-79) 147/65 (04/02 0829) SpO2:  [94 %-100 %] 99 % (04/02 0825) Weight:  [60.2 kg] 60.2 kg (04/02 0650)  Intake/Output from previous day: 04/01 0701 - 04/02 0700 In: 7940 [P.O.:180; I.V.:1010; IV Piggyback:50] Out: 9741 [Urine:9300] Intake/Output this shift: No intake/output data recorded.  Physical Exam:  General: Alert but confused about her procedure yesterday, laying in bed in NAD CV: Regular rate Lungs: NWOB on [place] Abdomen: Soft, ND, NT GU: No right CVAT. 24Fr 3-way hematuria catheter draining light pink urine on moderate drip CBI; CBI slowed to slow drip Ext: Warm and well-perfused   Lab Results: Recent Labs    04/15/20 0319 04/15/20 0906 04/16/20 0311  HGB 7.5* 7.5* 6.8*  HCT 24.4* 24.7* 22.8*   BMET Recent Labs    04/15/20 0901 04/16/20 0311  NA 140 138  K 4.2 4.6  CL 112* 109  CO2 23 23  GLUCOSE 106* 139*  BUN 23 25*  CREATININE 1.31* 1.33*  CALCIUM 8.0* 8.1*     Studies/Results: US RENAL  Result Date: 04/15/2020 CLINICAL DATA:  Gross hematuria EXAM: RENAL / URINARY TRACT ULTRASOUND COMPLETE COMPARISON:  None. FINDINGS: Right Kidney: Renal measurements: 8.8 x 4.1 x 6.8 cm = volume: 128 mL. Echogenicity within normal limits. No mass. Moderate hydronephrosis. Left Kidney: Renal measurements: 10.0 x 4.6 x 4.8 = volume: 117 mL. Echogenicity within normal limits. No mass or hydronephrosis visualized. Bladder: The collapsed around Foley catheter. Other: None IMPRESSION: Moderate right hydronephrosis indicative of ureteral obstruction which may be due to stone, stricture,  or mass. Electronically Signed   By: Miachel Roux M.D.   On: 04/15/2020 07:59   DG Chest Port 1 View  Result Date: 04/15/2020 CLINICAL DATA:  Cough, weakness, lethargy EXAM: PORTABLE CHEST 1 VIEW COMPARISON:  None. FINDINGS: Cardiomegaly with mild vascular congestion and nonspecific slight interstitial prominence. Minor basilar atelectasis. No large effusion or pneumothorax. Bones are osteopenic. Atherosclerosis of the aorta. Scoliosis of the spine noted. IMPRESSION: Cardiomegaly with vascular congestion and basilar atelectasis. Electronically Signed   By: Jerilynn Mages.  Shick M.D.   On: 04/15/2020 10:20    Assessment/Plan:   1. Gross hematuria secondary to large bladder mass encompassing 75% bladder, likely at least clinical stage T3 bladder cancer, c/b right ureteral obstruction with moderate right hydronephrosis. Now s/p incomplete TURBT on 04/15/20. Unable to visualize right UO due to obstruction by large mass. Pathology pending. 24 Fr hematuria 3-way catheter in place on CBI  - Titrate CBI to light pink. Ideally can wean CBI off today/tonight and attempt a trial of void tomorrow - Follow up pathology. Likely at least clinical stage T3 bladder cancer. Not a cystectomy candidate given her elderly age and comorbidities. We will discuss options further including palliative care or palliative chemoradiotherapy   2. Anemia. Has required tranfusion support during current hospitalization. Hgb 6.8 this morning. Getting 1u pRBC this morning  - Transfuse as needed for Hgb <7    3.  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 Asymptomatic. Cr mildly elevated at ~1.3 but stable.        - Will defer right  nephrostomy tube placement for now given patient asymptomatic. Pending GOC discussion, would consider nephrostomy tube placement if patient would like to pursue palliative chemotherapy     LOS: 2 days   I have seen the pt with Dr Sheppard Coil. I agree with the above assessment and plan.  No need for perc tuber at this point

## 2020-04-16 NOTE — Progress Notes (Signed)
PROGRESS NOTE  Sharon Huff WNU:[id number] DOB: 01-16-1934   PCP: System, Provider Not In  Patient is from: Home  DOA: 04/14/2020 LOS: 2  Chief complaints: Hematuria and anemia  Brief Narrative / Interim history: 87 year old F with no significant PMH other than hyperlipidemia presented to [facility] ED with generalized weakness and hematuria, and found to have Hgb of 3.4 in the setting of gross hematuria.  CT abdomen and pelvis showed posterior wall bladder changes concerning for possible mass versus debris's as well as severe right hydronephrosis.  Transfused 3 units and Hgb improved to 8.4, and transferred here for further evaluation and treatment.  Patient underwent cystoscopy that revealed large bladder mass encompassing 75% of her bladder.  She had incomplete TURBT.  Pathology pending.  Per urology not a candidate for cystectomy but possible nephrostomy.    Subjective: Seen and examined earlier this morning.  No major events overnight of this morning.  Remains on CBI.  Hgb dropped to 6.8.  One unit of PRBC ordered.  She reports a dry cough but no dyspnea or chest pain.  She denies nausea, vomiting, abdominal pain or fever.  Patient son at bedside.  Objective: Vitals:   04/16/20 0825 04/16/20 0829 04/16/20 0844 04/16/20 1140  BP:  (!) 147/65 (!) 156/82 133/69  Pulse:  83 90 72  Resp:  18 20 18   Temp:  98.8 F (37.1 C) 98.4 F (36.9 C) 98.8 F (37.1 C)  TempSrc:  Oral  Oral  SpO2: 99% 98%  95%  Weight:      Height:        Intake/Output Summary (Last 24 hours) at 04/16/2020 1247 Last data filed at 04/16/2020 1140 Gross per 24 hour  Intake 6998.33 ml  Output 8700 ml  Net -1701.67 ml   Filed Weights   04/16/20 0650  Weight: 60.2 kg    Examination:  GENERAL: No apparent distress.  Nontoxic. HEENT: MMM.  Vision and hearing grossly intact.  NECK: Supple.  No apparent JVD.  RESP: 94% on 2 L.  No IWOB.  Diminished aeration bilaterally.  Some degree of kyphosis. CVS:   RRR. Heart sounds normal.  ABD/GI/GU: BS+. Abd soft, NTND.  On CBI. MSK/EXT:  Moves extremities. No apparent deformity. No edema.  SKIN: no apparent skin lesion or wound NEURO: Awake, alert and oriented appropriately.  No apparent focal neuro deficit. PSYCH: Calm. Normal affect.  Procedures:  4/1-continuous bladder irrigation 4/1-cystoscopy/incomplete TURBT  Microbiology summarized: [id number] PCR pending Urine culture pending  Assessment & Plan: ABLA due to gross hematuria due to large bladder mass encompassing about 75% of her bladder concerning for at least stage III bladder cancer: Hgb 3.4 at OSH>3u>8.4> 7.5.  Unknown baseline. Posterior bladder mass with moderate right hydronephrosis Recent Labs    04/15/20 0319 04/15/20 0906 04/16/20 0311  HGB 7.5* 7.5* 6.8*  -Hgb down to 6.8.  Transfusing 1 unit.  Transfuse for Hgb<7.0. -Per urology, not a candidate for cystectomy but possibly nephrostomy -Palliative medicine consulted for goal of care discussion  Acute CHF: unknown type.  No significant respiratory distress other than dry cough.  Saturation 95 to 98% on 2 L.  BNP 833.  CXR with cardiomegaly and vascular congestion.  Does not appear fluid overloaded on exam.  Difficult to measure I&O given CBI.  -Follow echocardiogram -Hold off IV Lasix in the setting of AKI.  No respiratory distress.  Clinically appears euvolemic. -As needed DuoNeb  AKI/azotemia: Unclear baseline.  Due to obstructive uropathy.  Renal US with  somewhat atrophic right kidney and moderate hydronephrosis but normal echogenicity Recent Labs    04/15/20 0319 04/15/20 0901 04/16/20 0311  BUN 25* 23 25*  CREATININE 1.51* 1.31* 1.33*  -Monitor renal function -Avoid nephrotoxic meds   Elevated LFT: Pattern consistent with EtOH or rhabdo.  CK mildly elevated to 348.  Congestive hepatopathy?  No GI symptoms.  Improving. -Check acute hepatitis panel -Continue monitoring  Leukocytosis/thrombocytosis-reports  productive cough, and CT with concern for bladder mass/debris's.  No obvious signs of infection.  Procalcitonin improving without antibiotics -Continue monitoring  Debility/generalized weakness -PT/OT once she is off continuous bladder irrigation.    Body mass index is 24.27 kg/m.         DVT prophylaxis:  SCDs Start: 04/15/20 0040  Code Status: Full code Family Communication: Updated patient's son at bedside. Level of care: Telemetry  Status is: Inpatient  Remains inpatient appropriate because:Ongoing diagnostic testing needed not appropriate for outpatient work up, IV treatments appropriate due to intensity of illness or inability to take PO and Inpatient level of care appropriate due to severity of illness   Dispo: The patient is from: Home              Anticipated d/c is to: Home              Patient currently is not medically stable to d/c.   Difficult to place patient No            Consultants:  Urology Palliative medicine   Sch Meds:  Scheduled Meds: . Chlorhexidine Gluconate Cloth  6 each Topical Daily   Continuous Infusions: . sodium chloride irrigation     PRN Meds:.acetaminophen **OR** acetaminophen, guaiFENesin-dextromethorphan, ipratropium-albuterol, ondansetron **OR** ondansetron (ZOFRAN) IV  Antimicrobials: Anti-infectives (From admission, onward)   Start     Dose/Rate Route Frequency Ordered Stop   04/15/20 1433  ceFAZolin (ANCEF) 2-4 GM/100ML-% IVPB       Note to Pharmacy: Sharon Huff   : cabinet override      04/15/20 1433 04/16/20 0244       I have personally reviewed the following labs and images: CBC: Recent Labs  Lab 04/15/20 0319 04/15/20 0906 04/16/20 0311  WBC 14.4* 15.8* 14.6*  NEUTROABS  --   --  12.9*  HGB 7.5* 7.5* 6.8*  HCT 24.4* 24.7* 22.8*  MCV 83.8 84.9 86.7  PLT 519* 498* 422*   BMP &GFR Recent Labs  Lab 04/15/20 0319 04/15/20 0901 04/16/20 0311  NA 142 140 138  K 4.2 4.2 4.6  CL 113* 112* 109   CO2 22 23 23   GLUCOSE 120* 106* 139*  BUN 25* 23 25*  CREATININE 1.51* 1.31* 1.33*  CALCIUM 8.5* 8.0* 8.1*  MG  --  2.0 1.9   Estimated Creatinine Clearance: 26.9 mL/min (A) (by C-G formula based on SCr of 1.33 mg/dL (H)). Liver & Pancreas: Recent Labs  Lab 04/15/20 0319 04/15/20 0901 04/16/20 0311  AST 214* 170* 88*  ALT 187* 171* 119*  ALKPHOS 60 55 58  BILITOT 1.2 1.1 0.7  PROT 5.9* 5.5* 5.1*  ALBUMIN 2.8* 2.8* 2.4*   No results for input(s): LIPASE, AMYLASE in the last 168 hours. No results for input(s): AMMONIA in the last 168 hours. Diabetic: No results for input(s): HGBA1C in the last 72 hours. No results for input(s): GLUCAP in the last 168 hours. Cardiac Enzymes: Recent Labs  Lab 04/15/20 0319  CKTOTAL 348*   No results for input(s): PROBNP in the last 8760 hours. Coagulation  Profile: Recent Labs  Lab 04/15/20 0319  INR 1.2   Thyroid Function Tests: No results for input(s): TSH, T4TOTAL, FREET4, T3FREE, THYROIDAB in the last 72 hours. Lipid Profile: No results for input(s): CHOL, HDL, LDLCALC, TRIG, CHOLHDL, LDLDIRECT in the last 72 hours. Anemia Panel: No results for input(s): VITAMINB12, FOLATE, FERRITIN, TIBC, IRON, RETICCTPCT in the last 72 hours. Urine analysis: No results found for: COLORURINE, APPEARANCEUR, LABSPEC, PHURINE, GLUCOSEU, HGBUR, BILIRUBINUR, KETONESUR, PROTEINUR, UROBILINOGEN, NITRITE, LEUKOCYTESUR Sepsis Labs: Invalid input(s): PROCALCITONIN, [place]  Microbiology: No results found for this or any previous visit (from the past 240 hour(s)).  Radiology Studies: No results found.   Sharon Huff T. [place]  If 7PM-7AM, please contact night-coverage [email or web address] 04/16/2020, 12:47 PM

## 2020-04-17 DIAGNOSIS — R531 Weakness: Secondary | ICD-10-CM | POA: Diagnosis not present

## 2020-04-17 DIAGNOSIS — N3289 Other specified disorders of bladder: Secondary | ICD-10-CM | POA: Diagnosis not present

## 2020-04-17 DIAGNOSIS — D62 Acute posthemorrhagic anemia: Secondary | ICD-10-CM | POA: Diagnosis not present

## 2020-04-17 DIAGNOSIS — Z515 Encounter for palliative care: Secondary | ICD-10-CM

## 2020-04-17 DIAGNOSIS — Z7189 Other specified counseling: Secondary | ICD-10-CM | POA: Diagnosis not present

## 2020-04-17 DIAGNOSIS — I509 Heart failure, unspecified: Secondary | ICD-10-CM | POA: Diagnosis not present

## 2020-04-17 DIAGNOSIS — D494 Neoplasm of unspecified behavior of bladder: Secondary | ICD-10-CM

## 2020-04-17 DIAGNOSIS — R310 Gross hematuria: Secondary | ICD-10-CM | POA: Diagnosis not present

## 2020-04-17 LAB — CBC
HCT: 27 % — ABNORMAL LOW (ref 36.0–46.0)
Hemoglobin: 8.2 g/dL — ABNORMAL LOW (ref 12.0–15.0)
MCH: 27 pg (ref 26.0–34.0)
MCHC: 30.4 g/dL (ref 30.0–36.0)
MCV: 88.8 fL (ref 80.0–100.0)
Platelets: 381 10*3/uL (ref 150–400)
RBC: 3.04 MIL/uL — ABNORMAL LOW (ref 3.87–5.11)
RDW: 18.4 % — ABNORMAL HIGH (ref 11.5–15.5)
WBC: 13.3 10*3/uL — ABNORMAL HIGH (ref 4.0–10.5)
nRBC: 0.3 % — ABNORMAL HIGH (ref 0.0–0.2)

## 2020-04-17 LAB — URINALYSIS, ROUTINE W REFLEX MICROSCOPIC

## 2020-04-17 LAB — BRAIN NATRIURETIC PEPTIDE: B Natriuretic Peptide: 669.5 pg/mL — ABNORMAL HIGH (ref 0.0–100.0)

## 2020-04-17 LAB — TYPE AND SCREEN
ABO/RH(D): O POS
Antibody Screen: NEGATIVE
Unit division: 0

## 2020-04-17 LAB — RENAL FUNCTION PANEL
Albumin: 2.5 g/dL — ABNORMAL LOW (ref 3.5–5.0)
Anion gap: 7 (ref 5–15)
BUN: 26 mg/dL — ABNORMAL HIGH (ref 8–23)
CO2: 24 mmol/L (ref 22–32)
Calcium: 8.3 mg/dL — ABNORMAL LOW (ref 8.9–10.3)
Chloride: 109 mmol/L (ref 98–111)
Creatinine, Ser: 1.28 mg/dL — ABNORMAL HIGH (ref 0.44–1.00)
GFR, Estimated: 41 mL/min — ABNORMAL LOW (ref >60)
Glucose, Bld: 105 mg/dL — ABNORMAL HIGH (ref 70–99)
Phosphorus: 3.3 mg/dL (ref 2.5–4.6)
Potassium: 4 mmol/L (ref 3.5–5.1)
Sodium: 140 mmol/L (ref 135–145)

## 2020-04-17 LAB — URINALYSIS, MICROSCOPIC (REFLEX)
RBC / HPF: >50 RBC/hpf (ref 0–5)
Squamous Epithelial / HPF: NONE SEEN (ref 0–5)

## 2020-04-17 LAB — BPAM RBC
Blood Product Expiration Date: 202204272359
ISSUE DATE / TIME: 202204020825
Unit Type and Rh: 5100

## 2020-04-17 LAB — MAGNESIUM: Magnesium: 1.9 mg/dL (ref 1.7–2.4)

## 2020-04-17 LAB — PROCALCITONIN: Procalcitonin: 0.1 ng/mL

## 2020-04-17 MED ORDER — LIP MEDEX EX OINT
1.0000 "application " | TOPICAL_OINTMENT | CUTANEOUS | Status: DC | PRN
  Filled 2020-04-17: qty 7

## 2020-04-17 MED ORDER — FUROSEMIDE 10 MG/ML IJ SOLN
40.0000 mg | Freq: Once | INTRAMUSCULAR | Status: AC
  Administered 2020-04-17: 40 mg via INTRAVENOUS
  Filled 2020-04-17: qty 4

## 2020-04-17 MED ORDER — POLYETHYLENE GLYCOL 3350 17 G PO PACK: 17.0000 g | PACK | Freq: Two times a day (BID) | ORAL | Status: DC | PRN

## 2020-04-17 MED ORDER — DOCUSATE SODIUM 100 MG PO CAPS
100.0000 mg | ORAL_CAPSULE | Freq: Every day | ORAL | Status: DC
  Administered 2020-04-17 – 2020-04-19 (×3): 100 mg via ORAL
  Filled 2020-04-17 (×3): qty 1

## 2020-04-17 MED ORDER — SENNOSIDES-DOCUSATE SODIUM 8.6-50 MG PO TABS: 1.0000 | ORAL_TABLET | Freq: Two times a day (BID) | ORAL | Status: DC | PRN

## 2020-04-17 NOTE — Progress Notes (Signed)
Pts urine is close to cherry red, clear, no clots, and draining with no issues. Pt is sitting in chair, denies pain or other discomfort, no bladder distention noted. Notified Celene Squibb of these findings due to urine being a darker color than previously in shift. No new orders placed, per Sheppard Coil continue to monitor urine and will contact her if catheter not draining or any other abnormal findings.

## 2020-04-17 NOTE — Progress Notes (Signed)
PROGRESS NOTE  Sharon Huff QPR:[id number] DOB: 03-18-1933   PCP: System, Provider Not In  Patient is from: Home  DOA: 04/14/2020 LOS: 3  Chief complaints: Hematuria and anemia  Brief Narrative / Interim history: 87 year old F with no significant PMH other than hyperlipidemia presented to [facility] ED with generalized weakness and hematuria, and found to have Hgb of 3.4 in the setting of gross hematuria.  CT abdomen and pelvis showed posterior wall bladder changes concerning for possible mass versus debris's as well as severe right hydronephrosis.  Transfused 3 units and Hgb improved to 8.4, and transferred here for further evaluation and treatment.  Patient underwent cystoscopy that revealed large bladder mass encompassing 75% of her bladder.  She had incomplete TURBT.  Pathology pending.  Per urology not a candidate for cystectomy but possible nephrostomy for palliation.  Urology suggested comfort care.  However, patient asking about other treatment options such as chemotherapy when she met with palliative medicine.  Oncology consulted.   Subjective: Seen and examined earlier this morning.  No major events overnight of this morning.  No complaints.  Reports improvement in her cough.  Denies shortness of breath, chest pain or GI symptoms.  She says she has not had bowel movement since admission.  Objective: Vitals:   04/16/20 1140 04/16/20 1316 04/16/20 1946 04/17/20 0505  BP: 133/69 (!) 135/58 (!) 133/55 (!) 142/64  Pulse: 72 92 87 67  Resp: _0 Temp: 98.8 F (37.1 C) 98.3 F (36.8 C) 98.3 F (36.8 C) 97.9 F (36.6 C)  TempSrc: Oral  Oral Oral  SpO2: 95% 99% 97% 97%  Weight:      Height:        Intake/Output Summary (Last 24 hours) at 04/17/2020 1331 Last data filed at 04/17/2020 1256 Gross per 24 hour  Intake 6720 ml  Output 9150 ml  Net -2430 ml   Filed Weights   04/16/20 0650  Weight: 60.2 kg    Examination:  GENERAL: No apparent distress.   Nontoxic. HEENT: MMM.  Vision and hearing grossly intact.  NECK: Supple.  No apparent JVD.  RESP: 97% on 2 L.  No IWOB.  Diminished aeration bilaterally but some degree of kyphosis. CVS:  RRR. Heart sounds normal.  ABD/GI/GU: BS+. Abd soft, NTND.  On CBI with light pink urine. MSK/EXT:  Moves extremities. No apparent deformity. No edema.  SKIN: no apparent skin lesion or wound NEURO: Awake, alert and oriented appropriately.  No apparent focal neuro deficit. PSYCH: Calm. Normal affect.   Procedures:  4/1-continuous bladder irrigation 4/1-cystoscopy/incomplete TURBT  Microbiology summarized: [id number] PCR pending Urine culture pending  Assessment & Plan: ABLA due to gross hematuria due to bladder mass: Hgb 3.4 at OSH>3u>8.4>>6.8>1u>>8.2.  Unknown baseline.  Recent Labs    04/15/20 0319 04/15/20 0906 04/16/20 0311 04/16/20 1332 04/17/20 0307  HGB 7.5* 7.5* 6.8* 8.5* 8.2*  -Continue monitoring H&H  Large bladder mass encompassing about 75% of the bladder concerning for at least stage III bladder cancer Posterior bladder mass with moderate right hydronephrosis -Status post cystoscopy and partial TURBT.  Follow-up pathology -Per urology, not a candidate for cystectomy given the stage and stage and suggested comfort care -Palliative medicine consulted and met with the patient.  Per palliative medicine, patient asking about other treatment options -Oncology consulted  Acute diastolic CHF: New diagnosis.  TTE with LVEF of 60 to 65%, G1 DD moderate LAE and RVSP of 42.6 mmHg.  Elevated BNP to 833 in the setting of  IV fluid and blood transfusion.  CXR with cardiomegaly and vascular congestion. -Trial of IV Lasix 40 mg x 1 today -Monitor urine output and renal function  AKI/azotemia: Unclear baseline.  Due to obstructive uropathy.  Renal US with somewhat atrophic right kidney and moderate hydronephrosis but normal echogenicity Recent Labs    04/15/20 0319 04/15/20 0901 04/16/20 0311  04/17/20 0307  BUN 25* 23 25* 26*  CREATININE 1.51* 1.31* 1.33* 1.28*  -Monitor renal function -Avoid nephrotoxic meds   Elevated LFT: Pattern consistent with EtOH or rhabdo.  CK mildly elevated to 348.  Congestive hepatopathy?  No GI symptoms.  Improving. -Check acute hepatitis panel in the morning -Continue monitoring  Leukocytosis/thrombocytosis-improved.  No fever.  Procalcitonin down to normal without antibiotics.  Due to malignancy? -Continue monitoring  Debility/generalized weakness -PT/OT     Body mass index is 24.27 kg/m.         DVT prophylaxis:  SCDs Start: 04/15/20 0040  Code Status: Full code Family Communication: Updated patient's son at bedside. Level of care: Telemetry  Status is: Inpatient  Remains inpatient appropriate because:Ongoing diagnostic testing needed not appropriate for outpatient work up, IV treatments appropriate due to intensity of illness or inability to take PO and Inpatient level of care appropriate due to severity of illness   Dispo: The patient is from: Home              Anticipated d/c is to: Home              Patient currently is not medically stable to d/c.   Difficult to place patient No            Consultants:  Urology Palliative medicine Oncology   Sch Meds:  Scheduled Meds: . Chlorhexidine Gluconate Cloth  6 each Topical Daily  . docusate sodium  100 mg Oral Daily   Continuous Infusions:  PRN Meds:.acetaminophen **OR** acetaminophen, guaiFENesin-dextromethorphan, ipratropium-albuterol, lip balm, ondansetron **OR** ondansetron (ZOFRAN) IV, polyethylene glycol, senna-docusate  Antimicrobials: Anti-infectives (From admission, onward)   Start     Dose/Rate Route Frequency Ordered Stop   04/15/20 1433  ceFAZolin (ANCEF) 2-4 GM/100ML-% IVPB       Note to Pharmacy: Sharon Huff   : cabinet override      04/15/20 1433 04/16/20 0244       I have personally reviewed the following labs and  images: CBC: Recent Labs  Lab 04/15/20 0319 04/15/20 0906 04/16/20 0311 04/16/20 1332 04/17/20 0307  WBC 14.4* 15.8* 14.6*  --  13.3*  NEUTROABS  --   --  12.9*  --   --   HGB 7.5* 7.5* 6.8* 8.5* 8.2*  HCT 24.4* 24.7* 22.8* 27.4* 27.0*  MCV 83.8 84.9 86.7  --  88.8  PLT 519* 498* 422*  --  381   BMP &GFR Recent Labs  Lab 04/15/20 0319 04/15/20 0901 04/16/20 0311 04/17/20 0307  NA 142 140 138 140  K 4.2 4.2 4.6 4.0  CL 113* 112* 109 109  CO2 _0 GLUCOSE 120* 106* 139* 105*  BUN 25* 23 25* 26*  CREATININE 1.51* 1.31* 1.33* 1.28*  CALCIUM 8.5* 8.0* 8.1* 8.3*  MG  --  2.0 1.9 1.9  PHOS  --   --   --  3.3   Estimated Creatinine Clearance: 27.9 mL/min (A) (by C-G formula based on SCr of 1.28 mg/dL (H)). Liver & Pancreas: Recent Labs  Lab 04/15/20 0319 04/15/20 0901 04/16/20 0311 04/17/20 0307  AST 214* 170*  88*  --   ALT 187* 171* 119*  --   ALKPHOS 60 55 58  --   BILITOT 1.2 1.1 0.7  --   PROT 5.9* 5.5* 5.1*  --   ALBUMIN 2.8* 2.8* 2.4* 2.5*   No results for input(s): LIPASE, AMYLASE in the last 168 hours. No results for input(s): AMMONIA in the last 168 hours. Diabetic: No results for input(s): HGBA1C in the last 72 hours. No results for input(s): GLUCAP in the last 168 hours. Cardiac Enzymes: Recent Labs  Lab 04/15/20 0319  CKTOTAL 348*   No results for input(s): PROBNP in the last 8760 hours. Coagulation Profile: Recent Labs  Lab 04/15/20 0319  INR 1.2   Thyroid Function Tests: No results for input(s): TSH, T4TOTAL, FREET4, T3FREE, THYROIDAB in the last 72 hours. Lipid Profile: No results for input(s): CHOL, HDL, LDLCALC, TRIG, CHOLHDL, LDLDIRECT in the last 72 hours. Anemia Panel: No results for input(s): VITAMINB12, FOLATE, FERRITIN, TIBC, IRON, RETICCTPCT in the last 72 hours. Urine analysis:    Component Value Date/Time   COLORURINE RED (A) 04/17/2020 0608   APPEARANCEUR TURBID (A) 04/17/2020 0608   LABSPEC  04/17/2020 0608     TEST NOT REPORTED DUE TO COLOR INTERFERENCE OF URINE PIGMENT   PHURINE  04/17/2020 6773    TEST NOT REPORTED DUE TO COLOR INTERFERENCE OF URINE PIGMENT   GLUCOSEU (A) 04/17/2020 0608    TEST NOT REPORTED DUE TO COLOR INTERFERENCE OF URINE PIGMENT   HGBUR (A) 04/17/2020 0608    TEST NOT REPORTED DUE TO COLOR INTERFERENCE OF URINE PIGMENT   BILIRUBINUR (A) 04/17/2020 0608    TEST NOT REPORTED DUE TO COLOR INTERFERENCE OF URINE PIGMENT   KETONESUR (A) 04/17/2020 0608    TEST NOT REPORTED DUE TO COLOR INTERFERENCE OF URINE PIGMENT   PROTEINUR (A) 04/17/2020 0608    TEST NOT REPORTED DUE TO COLOR INTERFERENCE OF URINE PIGMENT   NITRITE (A) 04/17/2020 0608    TEST NOT REPORTED DUE TO COLOR INTERFERENCE OF URINE PIGMENT   LEUKOCYTESUR (A) 04/17/2020 0608    TEST NOT REPORTED DUE TO COLOR INTERFERENCE OF URINE PIGMENT   Sepsis Labs: Invalid input(s): PROCALCITONIN, [place]  Microbiology: No results found for this or any previous visit (from the past 240 hour(s)).  Radiology Studies: No results found.   Kolina Kube T. [place]  If 7PM-7AM, please contact night-coverage [email or web address] 04/17/2020, 1:31 PM

## 2020-04-17 NOTE — Progress Notes (Signed)
Sharon Huff  [phone]   HEMATOLOGY ONCOLOGY INPATIENT CONSULTATION   Sharon Huff  DOB: Apr 28, 1933  MR#: [id number]  CSN#: [id number]    Requesting Physician: [facility] Dr. Cyndia Skeeters   Patient Care Team: System, Provider Not In as PCP - General  Reason for consult: Newly diagnosed bladder cancer  History of present illness:   Ms. Sharon Huff is a lovely 87 year old female without significant past medical history, who was transferred from [facility] ED for newly onset hematuria and severe anemia.  He was seen by urology service here, underwent a cystoscopy last Friday, which showed a large mass in the bladder, passing 75% of her bladder.  She underwent incomplete TURBT.  Pathology is pending.  Urologist Dr. Diona Fanti feels she is not a candidate for surgery, and recommended palliative care and hospice.  Patient is interested in knowing other cancer treatment options.  I was called to discuss with patient and her family.  Patient lives independently, her son lives next to her house.  She denies any recent change of her appetite and energy level, no weight loss.  She has been in good health overall, but has not seen her primary care physician in the past 2 years the office due to the pandemic.   MEDICAL HISTORY:  Past Medical History:  Diagnosis Date  . Hyperlipidemia     SURGICAL HISTORY: Past Surgical History:  Procedure Laterality Date  . HEMORRHOID SURGERY    . TRANSURETHRAL RESECTION OF BLADDER TUMOR N/A 04/15/2020   Procedure: TRANSURETHRAL RESECTION OF BLADDER TUMOR (TURBT) ;  Surgeon: Janith Lima, MD;  Location: [facility];  Service: Urology;  Laterality: N/A;    SOCIAL HISTORY: Social History   Socioeconomic History  . Marital status: Widowed    Spouse name: Not on file  . Number of children: Not on file  . Years of education: Not on file  . Highest education level: Not on file  Occupational History  . Not on file  Tobacco Use  .  Smoking status: Never Smoker  . Smokeless tobacco: Never Used  Substance and Sexual Activity  . Alcohol use: Never  . Drug use: Never  . Sexual activity: Not on file  Other Topics Concern  . Not on file  Social History Narrative  . Not on file   Social Determinants of Health   Financial Resource Strain: Not on file  Food Insecurity: Not on file  Transportation Needs: Not on file  Physical Activity: Not on file  Stress: Not on file  Social Connections: Not on file  Intimate Partner Violence: Not on file    FAMILY HISTORY: Family History  Problem Relation Age of Onset  . Heart disease Mother   . Heart disease Father     ALLERGIES:  is allergic to other.  MEDICATIONS:  Current Facility-Administered Medications  Medication Dose Route Frequency Provider Last Rate Last Admin  . acetaminophen (TYLENOL) tablet 650 mg  650 mg Oral Q6H PRN Lenore Cordia, MD   650 mg at 04/17/20 1143   Or  . acetaminophen (TYLENOL) suppository 650 mg  650 mg Rectal Q6H PRN Lenore Cordia, MD      . Chlorhexidine Gluconate Cloth 2 % PADS 6 each  6 each Topical Daily Mercy Riding, MD   6 each at 04/17/20 [phone]  . docusate sodium (COLACE) capsule 100 mg  100 mg Oral Daily Wendee Beavers T, MD   100 mg at 04/17/20 1359  . guaiFENesin-dextromethorphan (ROBITUSSIN DM)  100-10 MG/5ML syrup 5 mL  5 mL Oral Q4H PRN Lenore Cordia, MD   5 mL at 04/15/20 0140  . ipratropium-albuterol (DUONEB) 0.5-2.5 (3) MG/3ML nebulizer solution 3 mL  3 mL Nebulization Q6H PRN Gonfa, Taye T, MD      . lip balm (CARMEX) ointment 1 application  1 application Topical PRN Gonfa, Taye T, MD      . ondansetron (ZOFRAN) tablet 4 mg  4 mg Oral Q6H PRN Lenore Cordia, MD       Or  . ondansetron (ZOFRAN) injection 4 mg  4 mg Intravenous Q6H PRN Lenore Cordia, MD   4 mg at 04/15/20 0246  . polyethylene glycol (MIRALAX / GLYCOLAX) packet 17 g  17 g Oral BID PRN Gonfa, Taye T, MD      . senna-docusate (Senokot-S) tablet 1 tablet  1  tablet Oral BID PRN Mercy Riding, MD        REVIEW OF SYSTEMS:   Constitutional: Denies fevers, chills or abnormal night sweats Eyes: Denies blurriness of vision, double vision or watery eyes Ears, nose, mouth, throat, and face: Denies mucositis or sore throat Respiratory: Denies cough, dyspnea or wheezes Cardiovascular: Denies palpitation, chest discomfort or lower extremity swelling Gastrointestinal:  Denies nausea, heartburn or change in bowel habits Skin: Denies abnormal skin rashes Lymphatics: Denies new lymphadenopathy or easy bruising Neurological:Denies numbness, tingling or new weaknesses Behavioral/Psych: Mood is stable, no new changes  All other systems were reviewed with the patient and are negative.  PHYSICAL EXAMINATION: ECOG PERFORMANCE STATUS: 1 - Symptomatic but completely ambulatory  Vitals:   04/17/20 0505 04/17/20 1353  BP: (!) 142/64 114/64  Pulse: 67 87  Resp: 16 18  Temp: 97.9 F (36.6 C) 98.4 F (36.9 C)  SpO2: 97% 92%   Filed Weights   04/16/20 0650  Weight: 132 lb 11.5 oz (60.2 kg)    GENERAL:alert, no distress and comfortable SKIN: skin color, texture, turgor are normal, no rashes or significant lesions EYES: normal, conjunctiva are pink and non-injected, sclera clear OROPHARYNX:no exudate, no erythema and lips, buccal mucosa, and tongue normal  NECK: supple, thyroid normal size, non-tender, without nodularity LYMPH:  no palpable lymphadenopathy in the cervical, axillary or inguinal LUNGS: clear to auscultation and percussion with normal breathing effort HEART: regular rate & rhythm and no murmurs and no lower extremity edema ABDOMEN:abdomen soft, non-tender and normal bowel sounds, (+) foley in place with bloody urine  Musculoskeletal:no cyanosis of digits and no clubbing  PSYCH: alert & oriented x 3 with fluent speech NEURO: no focal motor/sensory deficits  LABORATORY DATA:  I have reviewed the data as listed Lab Results  Component Value  Date   WBC 13.3 (H) 04/17/2020   HGB 8.2 (L) 04/17/2020   HCT 27.0 (L) 04/17/2020   MCV 88.8 04/17/2020   PLT 381 04/17/2020   Recent Labs    04/15/20 0319 04/15/20 0901 04/16/20 0311 04/17/20 0307  NA 142 140 138 140  K 4.2 4.2 4.6 4.0  CL 113* 112* 109 109  CO2 22 23 23 24   GLUCOSE 120* 106* 139* 105*  BUN 25* 23 25* 26*  CREATININE 1.51* 1.31* 1.33* 1.28*  CALCIUM 8.5* 8.0* 8.1* 8.3*  GFRNONAA 34* 40* 39* 41*  PROT 5.9* 5.5* 5.1*  --   ALBUMIN 2.8* 2.8* 2.4* 2.5*  AST 214* 170* 88*  --   ALT 187* 171* 119*  --   ALKPHOS 60 55 58  --   BILITOT 1.2  1.1 0.7  --     RADIOGRAPHIC STUDIES: I have personally reviewed the radiological images as listed and agreed with the findings in the report. US RENAL  Result Date: 04/15/2020 CLINICAL DATA:  Gross hematuria EXAM: RENAL / URINARY TRACT ULTRASOUND COMPLETE COMPARISON:  None. FINDINGS: Right Kidney: Renal measurements: 8.8 x 4.1 x 6.8 cm = volume: 128 mL. Echogenicity within normal limits. No mass. Moderate hydronephrosis. Left Kidney: Renal measurements: 10.0 x 4.6 x 4.8 = volume: 117 mL. Echogenicity within normal limits. No mass or hydronephrosis visualized. Bladder: The collapsed around Foley catheter. Other: None IMPRESSION: Moderate right hydronephrosis indicative of ureteral obstruction which may be due to stone, stricture, or mass. Electronically Signed   By: Miachel Roux M.D.   On: 04/15/2020 07:59   DG Chest Port 1 View  Result Date: 04/15/2020 CLINICAL DATA:  Cough, weakness, lethargy EXAM: PORTABLE CHEST 1 VIEW COMPARISON:  None. FINDINGS: Cardiomegaly with mild vascular congestion and nonspecific slight interstitial prominence. Minor basilar atelectasis. No large effusion or pneumothorax. Bones are osteopenic. Atherosclerosis of the aorta. Scoliosis of the spine noted. IMPRESSION: Cardiomegaly with vascular congestion and basilar atelectasis. Electronically Signed   By: Jerilynn Mages.  Shick M.D.   On: 04/15/2020 10:20    ECHOCARDIOGRAM COMPLETE  Result Date: 04/16/2020    ECHOCARDIOGRAM REPORT   Patient Name:   CHARL Dada Date of Exam: 04/16/2020 Medical Rec #:  [id number]       Height:       62.0 in Accession #:    [id number]      Weight:       132.7 lb Date of Birth:  [birth year]       BSA:          1.606 m Patient Age:    66 years        BP:           156/82 mmHg Patient Gender: F               HR:           86 bpm. Exam Location:  Inpatient Procedure: 2D Echo, Cardiac Doppler and Color Doppler Indications:    I50.23 Acute on chronic systolic (congestive) heart failure  History:        Patient has no prior history of Echocardiogram examinations.                 Risk Factors:Dyslipidemia.  Sonographer:    Tiffany Dance Referring Phys: [id number] Charlesetta Ivory GONFA IMPRESSIONS  1. Left ventricular ejection fraction, by estimation, is 60 to 65%. The left ventricle has normal function. The left ventricle has no regional wall motion abnormalities. There is mild left ventricular hypertrophy. Left ventricular diastolic parameters are consistent with Grade I diastolic dysfunction (impaired relaxation).  2. Right ventricular systolic function is normal. The right ventricular size is normal. There is mildly elevated pulmonary artery systolic pressure. The estimated right ventricular systolic pressure is 30.8 mmHg.  3. Left atrial size was moderately dilated.  4. Right atrial size was mildly dilated.  5. The mitral valve is normal in structure. Mild mitral valve regurgitation. No evidence of mitral stenosis.  6. The aortic valve is normal in structure. Aortic valve regurgitation is not visualized. No aortic stenosis is present.  7. The inferior vena cava is normal in size with greater than 50% respiratory variability, suggesting right atrial pressure of 3 mmHg. FINDINGS  Left Ventricle: Left ventricular ejection fraction, by estimation, is 60 to 65%. The left ventricle  has normal function. The left ventricle has no regional wall motion  abnormalities. The left ventricular internal cavity size was normal in size. There is  mild left ventricular hypertrophy. Left ventricular diastolic parameters are consistent with Grade I diastolic dysfunction (impaired relaxation). Right Ventricle: The right ventricular size is normal. No increase in right ventricular wall thickness. Right ventricular systolic function is normal. There is mildly elevated pulmonary artery systolic pressure. The tricuspid regurgitant velocity is 2.94  m/s, and with an assumed right atrial pressure of 8 mmHg, the estimated right ventricular systolic pressure is 94.4 mmHg. Left Atrium: Left atrial size was moderately dilated. Right Atrium: Right atrial size was mildly dilated. Pericardium: There is no evidence of pericardial effusion. Mitral Valve: The mitral valve is normal in structure. Mild mitral valve regurgitation. No evidence of mitral valve stenosis. Tricuspid Valve: The tricuspid valve is normal in structure. Tricuspid valve regurgitation is trivial. No evidence of tricuspid stenosis. Aortic Valve: The aortic valve is normal in structure. Aortic valve regurgitation is not visualized. No aortic stenosis is present. Pulmonic Valve: The pulmonic valve was normal in structure. Pulmonic valve regurgitation is not visualized. No evidence of pulmonic stenosis. Aorta: The aortic root is normal in size and structure. Venous: The inferior vena cava is normal in size with greater than 50% respiratory variability, suggesting right atrial pressure of 3 mmHg. IAS/Shunts: No atrial level shunt detected by color flow Doppler.  LEFT VENTRICLE PLAX 2D LVIDd:         3.70 cm  Diastology LVIDs:         2.50 cm  LV e' medial:    9.25 cm/s LV PW:         1.20 cm  LV E/e' medial:  8.8 LV IVS:        0.80 cm  LV e' lateral:   10.60 cm/s LVOT diam:     2.00 cm  LV E/e' lateral: 7.7 LV SV:         80 LV SV Index:   50 LVOT Area:     3.14 cm  RIGHT VENTRICLE             IVC RV Basal diam:  3.00 cm      IVC diam: 2.20 cm RV Mid diam:    1.70 cm RV S prime:     12.60 cm/s TAPSE (M-mode): 2.3 cm LEFT ATRIUM             Index       RIGHT ATRIUM           Index LA diam:        3.00 cm 1.87 cm/m  RA Area:     16.10 cm LA Vol (A2C):   75.0 ml 46.70 ml/m RA Volume:   45.10 ml  28.08 ml/m LA Vol (A4C):   68.8 ml 42.84 ml/m LA Biplane Vol: 72.6 ml 45.21 ml/m  AORTIC VALVE LVOT Vmax:   115.00 cm/s LVOT Vmean:  79.800 cm/s LVOT VTI:    0.254 m  AORTA Ao Root diam: 3.40 cm Ao Asc diam:  3.60 cm MITRAL VALVE                TRICUSPID VALVE MV Area (PHT): 2.95 cm     TR Peak grad:   34.6 mmHg MV Decel Time: 257 msec     TR Vmax:        294.00 cm/s MV E velocity: 81.20 cm/s MV A velocity: 107.00 cm/s  SHUNTS MV  E/A ratio:  0.76         Systemic VTI:  0.25 m                             Systemic Diam: 2.00 cm Candee Furbish MD Electronically signed by Candee Furbish MD Signature Date/Time: 04/16/2020/1:11:02 PM    Final     ASSESSMENT & PLAN:  87 year old Caucasian female, without significant past medical history, presented with hematuria and severe anemia  1.  Newly diagnosed bladder cancer, at least T3, s/p incomplete TURBT, not a surgical candidate  2.  Severe anemia secondary to hematuria, status post blood transfusion 3u 3.  Right hydronephrosis secondary to tumor obstruction  Recommendations: -Her outside CT abdomen pelvis showed posterior wall bladder as well as severe right hydronephrosis, no evidence of nodal or distant metastasis. -Patient felt to be not a candidate for surgery due to her advanced age. She has no systemic symptoms, and has good performance status. -I discussed the other treatment options, especially concurrent chemoradiation, or radiation alone, with small possibility of cure and preserve her bladder.  She does have mild AKI due to ureteral obstruction, which will make cisplatin chemotherapy challenge (plus her advanced age).  I do think that she is a candidate for radiation therapy. -I also  discussed the other option of systemic therapy, especially immunotherapy and or targeted therapy.  I would consider molecular testing targeted therapy. -After the above lengthy discussion, both patient and her family are interested in pursuing cancer treatment locally.  I will reach out to medical oncology and radiation oncology in [place], and refer her. -will f/u tomorrow before her discharge.    All questions were answered. The patient knows to call the clinic with any problems, questions or concerns.      Truitt Merle, MD 04/17/2020 4:16 PM

## 2020-04-17 NOTE — Progress Notes (Addendum)
2 Days Post-Op Subjective: AFHDS on RA. Hgb responded appropriately to transfusion yesterday and now stable. CBI clamped this morning with subsequent light pink draining urine a couple hours later  Denies right flank pain. Tolerating diet without nausea/emesis.   Objective: Vital signs in last 24 hours: Temp:  [97.9 F (36.6 C)-98.8 F (37.1 C)] 97.9 F (36.6 C) (04/03 0505) Pulse Rate:  [67-92] 67 (04/03 0505) Resp:  [16-20] 16 (04/03 0505) BP: (133-142)/(55-69) 142/64 (04/03 0505) SpO2:  [95 %-99 %] 97 % (04/03 0505)  Intake/Output from previous day: 04/02 0701 - 04/03 0700 In: 7518.3 [P.O.:1200; Blood:318.3] Out: 6800 [Urine:6800] Intake/Output this shift: Total I/O In: 240 [P.O.:240] Out: 2200 [Urine:2200]  Physical Exam:  General: Alert but confused about situation, laying in bed in NAD CV: Regular rate Lungs: NWOB on RA Abdomen: Soft, ND, NT GU: No right CVAT. 24Fr 3-way hematuria catheter draining light pink urine with CBI clamped  Ext: Warm and well-perfused   Lab Results: Recent Labs    04/16/20 0311 04/16/20 1332 04/17/20 0307  HGB 6.8* 8.5* 8.2*  HCT 22.8* 27.4* 27.0*   BMET Recent Labs    04/16/20 0311 04/17/20 0307  NA 138 140  K 4.6 4.0  CL 109 109  CO2 23 24  GLUCOSE 139* 105*  BUN 25* 26*  CREATININE 1.33* 1.28*  CALCIUM 8.1* 8.3*     Studies/Results: DG Chest Port 1 View  Result Date: 04/15/2020 CLINICAL DATA:  Cough, weakness, lethargy EXAM: PORTABLE CHEST 1 VIEW COMPARISON:  None. FINDINGS: Cardiomegaly with mild vascular congestion and nonspecific slight interstitial prominence. Minor basilar atelectasis. No large effusion or pneumothorax. Bones are osteopenic. Atherosclerosis of the aorta. Scoliosis of the spine noted. IMPRESSION: Cardiomegaly with vascular congestion and basilar atelectasis. Electronically Signed   By: Jerilynn Mages.  Shick M.D.   On: 04/15/2020 10:20   ECHOCARDIOGRAM COMPLETE  Result Date: 04/16/2020    ECHOCARDIOGRAM REPORT    Patient Name:   Sharon Huff Date of Exam: 04/16/2020 Medical Rec #:  [id number]       Height:       62.0 in Accession #:    [id number]      Weight:       132.7 lb Date of Birth:  01-Jan-1934       BSA:          1.606 m Patient Age:    87 years        BP:           156/82 mmHg Patient Gender: F               HR:           86 bpm. Exam Location:  Inpatient Procedure: 2D Echo, Cardiac Doppler and Color Doppler Indications:    I50.23 Acute on chronic systolic (congestive) heart failure  History:        Patient has no prior history of Echocardiogram examinations.                 Risk Factors:Dyslipidemia.  Sonographer:    Tiffany Dance Referring Phys: [id number] Charlesetta Ivory GONFA IMPRESSIONS  1. Left ventricular ejection fraction, by estimation, is 60 to 65%. The left ventricle has normal function. The left ventricle has no regional wall motion abnormalities. There is mild left ventricular hypertrophy. Left ventricular diastolic parameters are consistent with Grade I diastolic dysfunction (impaired relaxation).  2. Right ventricular systolic function is normal. The right ventricular size is normal. There is mildly elevated pulmonary artery systolic  pressure. The estimated right ventricular systolic pressure is 64.6 mmHg.  3. Left atrial size was moderately dilated.  4. Right atrial size was mildly dilated.  5. The mitral valve is normal in structure. Mild mitral valve regurgitation. No evidence of mitral stenosis.  6. The aortic valve is normal in structure. Aortic valve regurgitation is not visualized. No aortic stenosis is present.  7. The inferior vena cava is normal in size with greater than 50% respiratory variability, suggesting right atrial pressure of 3 mmHg. FINDINGS  Left Ventricle: Left ventricular ejection fraction, by estimation, is 60 to 65%. The left ventricle has normal function. The left ventricle has no regional wall motion abnormalities. The left ventricular internal cavity size was normal in size. There is   mild left ventricular hypertrophy. Left ventricular diastolic parameters are consistent with Grade I diastolic dysfunction (impaired relaxation). Right Ventricle: The right ventricular size is normal. No increase in right ventricular wall thickness. Right ventricular systolic function is normal. There is mildly elevated pulmonary artery systolic pressure. The tricuspid regurgitant velocity is 2.94  m/s, and with an assumed right atrial pressure of 8 mmHg, the estimated right ventricular systolic pressure is 80.3 mmHg. Left Atrium: Left atrial size was moderately dilated. Right Atrium: Right atrial size was mildly dilated. Pericardium: There is no evidence of pericardial effusion. Mitral Valve: The mitral valve is normal in structure. Mild mitral valve regurgitation. No evidence of mitral valve stenosis. Tricuspid Valve: The tricuspid valve is normal in structure. Tricuspid valve regurgitation is trivial. No evidence of tricuspid stenosis. Aortic Valve: The aortic valve is normal in structure. Aortic valve regurgitation is not visualized. No aortic stenosis is present. Pulmonic Valve: The pulmonic valve was normal in structure. Pulmonic valve regurgitation is not visualized. No evidence of pulmonic stenosis. Aorta: The aortic root is normal in size and structure. Venous: The inferior vena cava is normal in size with greater than 50% respiratory variability, suggesting right atrial pressure of 3 mmHg. IAS/Shunts: No atrial level shunt detected by color flow Doppler.  LEFT VENTRICLE PLAX 2D LVIDd:         3.70 cm  Diastology LVIDs:         2.50 cm  LV e' medial:    9.25 cm/s LV PW:         1.20 cm  LV E/e' medial:  8.8 LV IVS:        0.80 cm  LV e' lateral:   10.60 cm/s LVOT diam:     2.00 cm  LV E/e' lateral: 7.7 LV SV:         80 LV SV Index:   50 LVOT Area:     3.14 cm  RIGHT VENTRICLE             IVC RV Basal diam:  3.00 cm     IVC diam: 2.20 cm RV Mid diam:    1.70 cm RV S prime:     12.60 cm/s TAPSE (M-mode):  2.3 cm LEFT ATRIUM             Index       RIGHT ATRIUM           Index LA diam:        3.00 cm 1.87 cm/m  RA Area:     16.10 cm LA Vol (A2C):   75.0 ml 46.70 ml/m RA Volume:   45.10 ml  28.08 ml/m LA Vol (A4C):   68.8 ml 42.84 ml/m LA Biplane Vol: 72.6 ml  45.21 ml/m  AORTIC VALVE LVOT Vmax:   115.00 cm/s LVOT Vmean:  79.800 cm/s LVOT VTI:    0.254 m  AORTA Ao Root diam: 3.40 cm Ao Asc diam:  3.60 cm MITRAL VALVE                TRICUSPID VALVE MV Area (PHT): 2.95 cm     TR Peak grad:   34.6 mmHg MV Decel Time: 257 msec     TR Vmax:        294.00 cm/s MV E velocity: 81.20 cm/s MV A velocity: 107.00 cm/s  SHUNTS MV E/A ratio:  0.76         Systemic VTI:  0.25 m                             Systemic Diam: 2.00 cm Candee Furbish MD Electronically signed by Candee Furbish MD Signature Date/Time: 04/16/2020/1:11:02 PM    Final     Assessment/Plan:   1. Gross hematuria secondary to large bladder mass encompassing 75% bladder, likely at least clinical stage T3 bladder cancer, c/b right ureteral obstruction with moderate right hydronephrosis. Now s/p incomplete TURBT on 04/15/20. Unable to visualize right UO due to obstruction by large mass. Pathology pending. 24 Fr hematuria 3-way catheter in place draining light pink urine with CBI clamped x 2 hours.   - Please disconnect CBI and place foley plug in 3rd port of foley catheter. If hematuria remains mild, then will remove foley and perform TOV tomorrow. Ok to flush foley catheter as needed if it stops draining  - We will follow up pathology. Likely at least clinical stage T3 bladder cancer. Not a cystectomy candidate given her elderly age and comorbidities. We discussed goals of care with the patient and her son at length this morning. The patient and her son would like to maximize her quality of life and would prefer to transition to comfort care - Agree with palliative care consult  2. Anemia. Has required tranfusion support during current hospitalization. Hgb  appropriately responded to 1u pRBC yesterday and has remained stable today.   - Continue to trend Hgb and transfuse as needed for Hgb <7    3.  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 Asymptomatic. Cr mildly elevated at ~1.3 but stable/slightly improvement .        - Will defer right nephrostomy tube placement given patient asymptomatic. Patient would prefer to transition to comfort care, so nephrostomy tube would only be indicated in the future if she were to develop symptoms associated with right hydronephrosis      LOS: 3 days  I have interviewed the patient and also spoken with her son today.  At this point, percutaneous nephrostomy tube does not seem necessary.  They are satisfied with conservative management at this point and her son agrees with palliative care consultation.  I did discuss with them possible repeat TURBT which I think they are amenable to if necessary.

## 2020-04-17 NOTE — Consult Note (Signed)
Consultation Note Date: 04/17/2020   Patient Name: Sharon Huff  DOB: [birth year]  MRN: [id number]  Age / Sex: 87 y.o., female  PCP: System, Provider Not In Referring Physician: Mercy Riding, MD  Reason for Consultation: Establishing goals of care and Psychosocial/spiritual support  HPI/Patient Profile: 87 y.o. female  admitted on 04/14/2020 with with past medical history significant for hyperlipidemia who presented to a hospital in  [facility] ED for evaluation of generalized weakness and bloody urine.  Patient states she has been feeling generally weak over the last 2 days.  She lives alone and her son wanted to check in on her morning of 3/31.  They noticed that her bedsheets were bloody.  They went to the ED where she was found to have frank hematuria.  Hemoglobin was 3.4.  She was transfused 3 units PRBCs with subsequent hemoglobin improved to 8.4.  CT abdomen/pelvis showed posterior wall bladder changes concerning for possible mass vs debris as well as severe right hydronephrosis.  Patient transferred to [facility].   Patient underwent cystoscopy that revealed large bladder mass encompassing 75% of her bladder.  She had incomplete TURBT.  Pathology pending.  Per urology not a candidate for cystectomy but possible nephrostomy.   Patient and her family face treatment option decisions, advanced directive decisions and anticipatory care needs.  Clinical Assessment and Goals of Care:  This NP Wadie Lessen reviewed medical records, received report from team, assessed the patient and then meet at the patient's bedside along with her son/Clarence to discuss diagnosis, prognosis, GOC, EOL wishes disposition and options.   Concept of Palliative Care was introduced as specialized medical care for people and their families living with serious illness.  If focuses on providing relief from the symptoms and  stress of a serious illness.  The goal is to improve quality of life for both the patient and the family. Values and goals of care important to patient and family were attempted to be elicited.  Created space and opportunity for patient  and family to explore thoughts and feelings regarding current medical situation.  Both patient and her son verbalize an understanding that she has a large tumor burden in her bladder, is likely a poor candidate for chemotherapy and radiation and could possibly utilize TURBT procedure in the future  Comfort is a priority and the patient wishes to go home as soon as possible however they are asking for consultation with oncology to ensure that they have all the information to make informed healthcare decisions. Discussed with Dr. Cyndia Skeeters and he will consult oncology today.   A  discussion was had today regarding advanced directives.  Concepts specific to code status, artifical feeding and hydration, continued IV antibiotics and rehospitalization was had.  The difference between a aggressive medical intervention path  and a palliative comfort care path for this patient at this time was had.     MOST form  was introduced and a blank copy was left for review, a Hard choices booklet was left for review  Education offered on the difference between an aggressive medical intervention path and a palliative comfort path for this patient at this time in this situation.  Education offered on hospice benefit in the home.     Questions and concerns addressed.  Patient  encouraged to call with questions or concerns.     PMT will continue to support holistically, follow-up appointment at bedside for tomorrow morning at 9 AM.         No documented healthcare power of attorney or advanced care planning documents.  Patient son Jonessa Triplett is her next of kin and only child and will act as healthcare decision maker if the patient can not make her own decisions.   SUMMARY OF  RECOMMENDATIONS    Code Status/Advance Care Planning:  Full code  Encouraged patient/family to consider DNR/DNI status understanding evidenced based poor outcomes in similar hospitalized patient, as the cause of arrest is likely associated with advanced chronic illness rather than an easily reversible acute cardio-pulmonary event.   Palliative Prophylaxis:   Bowel Regimen, Delirium Protocol and Frequent Pain Assessment  Additional Recommendations (Limitations, Scope, Preferences):  Full Scope Treatment  Psycho-social/Spiritual:   Desire for further Chaplaincy support:no  Additional Recommendations: Education on Hospice  Prognosis:   Unable to determine  Discharge Planning: To Be Determined      Primary Diagnoses: Present on Admission: . Acute blood loss anemia . Hematuria . ABLA (acute blood loss anemia)   I have reviewed the medical record, interviewed the patient and family, and examined the patient. The following aspects are pertinent.  Past Medical History:  Diagnosis Date  . Hyperlipidemia    Social History   Socioeconomic History  . Marital status: Widowed    Spouse name: Not on file  . Number of children: Not on file  . Years of education: Not on file  . Highest education level: Not on file  Occupational History  . Not on file  Tobacco Use  . Smoking status: Never Smoker  . Smokeless tobacco: Never Used  Substance and Sexual Activity  . Alcohol use: Never  . Drug use: Never  . Sexual activity: Not on file  Other Topics Concern  . Not on file  Social History Narrative  . Not on file   Social Determinants of Health   Financial Resource Strain: Not on file  Food Insecurity: Not on file  Transportation Needs: Not on file  Physical Activity: Not on file  Stress: Not on file  Social Connections: Not on file   Family History  Problem Relation Age of Onset  . Heart disease Mother   . Heart disease Father    Scheduled Meds: . Chlorhexidine  Gluconate Cloth  6 each Topical Daily   Continuous Infusions: PRN Meds:.acetaminophen **OR** acetaminophen, guaiFENesin-dextromethorphan, ipratropium-albuterol, lip balm, ondansetron **OR** ondansetron (ZOFRAN) IV Medications Prior to Admission:  Prior to Admission medications   Medication Sig Start Date End Date Taking? Authorizing Provider  busPIRone (BUSPAR) 7.5 MG tablet Take 7.5 mg by mouth daily. 02/14/20  Yes [provider]  lovastatin (MEVACOR) 10 MG tablet Take 10 mg by mouth daily. 02/28/20  Yes [provider]  Vitamin D, Ergocalciferol, (DRISDOL) 1.25 MG (50000 UNIT) CAPS capsule Take 50,000 Units by mouth once a week. 02/08/20  Yes [provider]   Allergies  Allergen Reactions  . Other Other (See Comments)    Caramel    Review of Systems  Constitutional: Positive for fatigue.    Physical Exam Constitutional:  Appearance: She is underweight. She is ill-appearing.  Cardiovascular:     Rate and Rhythm: Normal rate.  Skin:    General: Skin is warm and dry.  Neurological:     Mental Status: She is alert and oriented to person, place, and time.     Vital Signs: BP (!) 142/64 (BP Location: Right Arm)   Pulse 67   Temp 97.9 F (36.6 C) (Oral)   Resp 16   Ht 5\' 2"  (1.575 m)   Wt 60.2 kg   SpO2 97%   BMI 24.27 kg/m  Pain Scale: 0-10   Pain Score: 0-No pain   SpO2: SpO2: 97 % O2 Device:SpO2: 97 % O2 Flow Rate: .O2 Flow Rate (L/min): 2 L/min  IO: Intake/output summary:   Intake/Output Summary (Last 24 hours) at 04/17/2020 1007 Last data filed at 04/17/2020 0900 Gross per 24 hour  Intake 7518.33 ml  Output 9000 ml  Net -1481.67 ml    LBM:   Baseline Weight: Weight: 60.2 kg Most recent weight: Weight: 60.2 kg     Palliative Assessment/Data:  30 %   Discussed with Dr Cyndia Skeeters  Time In: 1100 Time Out: 1210 Time Total: 70 minutes Greater than 50%  of this time was spent counseling and coordinating care related to the above  assessment and plan.  Signed by: Wadie Lessen, NP   Please contact Palliative Medicine Team phone at [phone] for questions and concerns.  For individual provider: See Shea Evans

## 2020-04-18 DIAGNOSIS — R310 Gross hematuria: Secondary | ICD-10-CM | POA: Diagnosis not present

## 2020-04-18 DIAGNOSIS — D62 Acute posthemorrhagic anemia: Secondary | ICD-10-CM | POA: Diagnosis not present

## 2020-04-18 DIAGNOSIS — I509 Heart failure, unspecified: Secondary | ICD-10-CM | POA: Diagnosis not present

## 2020-04-18 DIAGNOSIS — N3289 Other specified disorders of bladder: Secondary | ICD-10-CM | POA: Diagnosis not present

## 2020-04-18 LAB — HEPATITIS PANEL, ACUTE
HCV Ab: NONREACTIVE
Hep A IgM: NONREACTIVE
Hep B C IgM: NONREACTIVE
Hepatitis B Surface Ag: NONREACTIVE

## 2020-04-18 LAB — COMPREHENSIVE METABOLIC PANEL
ALT: 51 U/L — ABNORMAL HIGH (ref 0–44)
AST: 36 U/L (ref 15–41)
Albumin: 2.4 g/dL — ABNORMAL LOW (ref 3.5–5.0)
Alkaline Phosphatase: 65 U/L (ref 38–126)
Anion gap: 8 (ref 5–15)
BUN: 23 mg/dL (ref 8–23)
CO2: 26 mmol/L (ref 22–32)
Calcium: 8.3 mg/dL — ABNORMAL LOW (ref 8.9–10.3)
Chloride: 104 mmol/L (ref 98–111)
Creatinine, Ser: 1.23 mg/dL — ABNORMAL HIGH (ref 0.44–1.00)
GFR, Estimated: 43 mL/min — ABNORMAL LOW (ref >60)
Glucose, Bld: 106 mg/dL — ABNORMAL HIGH (ref 70–99)
Potassium: 3.4 mmol/L — ABNORMAL LOW (ref 3.5–5.1)
Sodium: 138 mmol/L (ref 135–145)
Total Bilirubin: 0.7 mg/dL (ref 0.3–1.2)
Total Protein: 5.1 g/dL — ABNORMAL LOW (ref 6.5–8.1)

## 2020-04-18 LAB — MAGNESIUM: Magnesium: 1.8 mg/dL (ref 1.7–2.4)

## 2020-04-18 LAB — URINE CULTURE: Culture: NO GROWTH

## 2020-04-18 LAB — CBC
HCT: 27.3 % — ABNORMAL LOW (ref 36.0–46.0)
Hemoglobin: 8.3 g/dL — ABNORMAL LOW (ref 12.0–15.0)
MCH: 26.9 pg (ref 26.0–34.0)
MCHC: 30.4 g/dL (ref 30.0–36.0)
MCV: 88.3 fL (ref 80.0–100.0)
Platelets: 407 10*3/uL — ABNORMAL HIGH (ref 150–400)
RBC: 3.09 MIL/uL — ABNORMAL LOW (ref 3.87–5.11)
RDW: 18.8 % — ABNORMAL HIGH (ref 11.5–15.5)
WBC: 11.2 10*3/uL — ABNORMAL HIGH (ref 4.0–10.5)
nRBC: 0.2 % (ref 0.0–0.2)

## 2020-04-18 LAB — PHOSPHORUS: Phosphorus: 3.5 mg/dL (ref 2.5–4.6)

## 2020-04-18 MED ORDER — FUROSEMIDE 20 MG PO TABS
20.0000 mg | ORAL_TABLET | Freq: Every day | ORAL | Status: DC
  Administered 2020-04-18 – 2020-04-19 (×2): 20 mg via ORAL
  Filled 2020-04-18 (×2): qty 1

## 2020-04-18 MED ORDER — POTASSIUM CHLORIDE CRYS ER 20 MEQ PO TBCR
40.0000 meq | EXTENDED_RELEASE_TABLET | ORAL | Status: AC
  Administered 2020-04-18 (×2): 40 meq via ORAL
  Filled 2020-04-18 (×2): qty 2

## 2020-04-18 MED ORDER — MAGNESIUM SULFATE 2 GM/50ML IV SOLN
2.0000 g | Freq: Once | INTRAVENOUS | Status: AC
  Administered 2020-04-18: 2 g via INTRAVENOUS
  Filled 2020-04-18: qty 50

## 2020-04-18 NOTE — Progress Notes (Signed)
Urine clear on slow drip. Irrigated without clots. Clamped CBI. Discussed with nursing to titrate to light pink to clear overnight. Hopefully, void trial tomorrow.  Matt R. [facility]  Pager: [phone]

## 2020-04-18 NOTE — Evaluation (Signed)
Occupational Therapy Evaluation Patient Details Name: Sharon Huff MRN: [id number] DOB: 03-15-1933 Today's Date: 04/18/2020    History of Present Illness Sharon Huff is a lovely 87 year old female without significant past medical history, who was transferred from [facility] ED 04/14/20 for newly onset hematuria and severe anemia.  He was seen by urology service here, underwent a cystoscopy last Friday, which showed a large mass in the bladder, passing 75% of her bladder.  She underwent incomplete TURBT on 04/15/2020.   Clinical Impression   Patient evaluated by Occupational Therapy with no further acute OT needs identified. All education has been completed and the patient has no further questions.  See below for any follow-up Occupational Therapy or equipment needs. OT is signing off and deferring to home health OT to address IADLs, and functional bathroom and kitchen safety in pt's own environment. Thank you for this referral.     Follow Up Recommendations  Home health OT;Supervision - Intermittent    Equipment Recommendations       Recommendations for Other Services       Precautions / Restrictions Precautions Precautions: Fall Restrictions Weight Bearing Restrictions: No      Mobility Bed Mobility Overal bed mobility: Needs Assistance Bed Mobility: Supine to Sit     Supine to sit: Supervision     General bed mobility comments: no assistnace Patient Response: Cooperative  Transfers Overall transfer level: Needs assistance Equipment used: Rolling walker (2 wheeled) Transfers: Sit to/from Stand Sit to Stand: Min guard         General transfer comment: cues for hand placement    Balance Overall balance assessment: No apparent balance deficits (not formally assessed)                                         ADL either performed or assessed with clinical judgement   ADL Overall ADL's : Needs assistance/impaired Eating/Feeding: Independent    Grooming: Wash/dry hands;Modified independent;Standing   Upper Body Bathing: Modified independent;Sitting   Lower Body Bathing: Supervison/ safety;Sitting/lateral leans   Upper Body Dressing : Set up;Sitting   Lower Body Dressing: Sitting/lateral leans;Sit to/from stand;Supervision/safety   Toilet Transfer: Supervision/safety;Comfort height toilet;RW;Ambulation   Toileting- Clothing Manipulation and Hygiene: Supervision/safety;Sit to/from stand;Sitting/lateral lean   Tub/ Shower Transfer: Min guard   Functional mobility during ADLs: Supervision/safety;Rolling walker       Vision Patient Visual Report: No change from baseline       Perception     Praxis      Pertinent Vitals/Pain Pain Assessment: No/denies pain     Hand Dominance Right   Extremity/Trunk Assessment Upper Extremity Assessment Upper Extremity Assessment: Generalized weakness   Lower Extremity Assessment Lower Extremity Assessment: Generalized weakness   Cervical / Trunk Assessment Cervical / Trunk Assessment: Normal   Communication Communication Communication: No difficulties   Cognition Arousal/Alertness: Awake/alert Behavior During Therapy: WFL for tasks assessed/performed Overall Cognitive Status: Within Functional Limits for tasks assessed                                     General Comments       Exercises     Shoulder Instructions      Home Living Family/patient expects to be discharged to:: Private residence (Simultaneous filing. User may not have seen previous data.) Living Arrangements: Alone (Simultaneous  filing. User may not have seen previous data.) Available Help at Discharge: Family;Personal care attendant;Available 24 hours/day (Pt's son reports that he is working on increasing pt's help at home to almost 24/7 with PCAs and himself.  Simultaneous filing. User may not have seen previous data.) Type of Home: House (Simultaneous filing. User may not have seen  previous data.) Home Access: Ramped entrance (Simultaneous filing. User may not have seen previous data.)     Home Layout: Two level;1/2 bath on main level;Bed/bath upstairs (Simultaneous filing. User may not have seen previous data.) Alternate Level Stairs-Number of Steps: Full flight with RT rail. Alternate Level Stairs-Rails: Right Bathroom Shower/Tub: Tub/shower unit   Bathroom Toilet: Handicapped height (Simultaneous filing. User may not have seen previous data.)     Home Equipment: Grab bars - tub/shower;Wheelchair - [employer] - 2 wheels;Bedside commode (Simultaneous filing. User may not have seen previous data.)   Additional Comments: Son reports WC is very wide as it belonged to his father.      Prior Functioning/Environment Level of Independence: Needs assistance (Simultaneous filing. User may not have seen previous data.)  Gait / Transfers Assistance Needed: Typically independent without use of AD. Pt denies falls in past year. ADL's / Homemaking Assistance Needed: Pt is independent with BADLs and has a housekeeper once a week. Pt performs her own cooking and laundry.   Comments: Son provides transportation.        OT Problem List: Decreased strength;Decreased activity tolerance      OT Treatment/Interventions:      OT Goals(Current goals can be found in the care plan Huff) Acute Rehab OT Goals Patient Stated Goal: go home OT Goal Formulation: With patient/family Potential to Achieve Goals: Good  OT Frequency:     Barriers to D/C:            Co-evaluation PT/OT/SLP Co-Evaluation/Treatment: Yes Reason for Co-Treatment: For patient/therapist safety PT goals addressed during session: Mobility/safety with mobility OT goals addressed during session: ADL's and self-care      AM-PAC OT "6 Clicks" Daily Activity     Outcome Measure Help from another person eating meals?: None Help from another person taking care of personal grooming?: None Help from  another person toileting, which includes using toliet, bedpan, or urinal?: A Little Help from another person bathing (including washing, rinsing, drying)?: A Little Help from another person to put on and taking off regular upper body clothing?: None Help from another person to put on and taking off regular lower body clothing?: A Little 6 Click Score: 21   End of Session Equipment Utilized During Treatment: Gait belt;Rolling walker Nurse Communication: Mobility status  Activity Tolerance: Patient tolerated treatment well Patient left: in chair;with call bell/phone within reach;with chair alarm set;with family/visitor present  OT Visit Diagnosis: Muscle weakness (generalized) (M62.81)                Time: 1941-7408 OT Time Calculation (min): 20 min Charges:  OT General Charges $OT Visit: 1 Visit OT Evaluation $OT Eval Low Complexity: 1 Low  Waller Marcussen, [place] Office: [phone] 04/18/2020   Sharon Huff 04/18/2020, 10:48 AM

## 2020-04-18 NOTE — Evaluation (Signed)
Physical Therapy Evaluation Patient Details Name: Sharon Huff: [id number] DOB: Mar 07, 1933 Today's Date: 04/18/2020   History of Present Illness  Sharon Huff is a lovely 87 year old female without significant past medical history, who was transferred from [facility] ED 04/14/20 for newly onset hematuria and severe anemia.  He was seen by urology service here, underwent a cystoscopy last Friday, which showed a large mass in the bladder, passing 75% of her bladder.  She underwent incomplete TURBT on 04/15/2020.  Clinical Impression  Patient is mobilizing well. Ambulated x 120' using RW and min guard. At baseline, independennt with ADL's, no device.. Patient has DME if needed.   Patient's son present, patient will have 24/7 assistance as needed.  Patient  Resting in bed on 3 L, SPO2 100%. Resting on RA 100%. Ambulated on Ra, SPO2 87% with quickly returning to 90%. Replaced on 3 L. Pt admitted with above diagnosis.  Pt currently with functional limitations due to the deficits listed below (see PT Problem List). Pt will benefit from skilled PT to increase their independence and safety with mobility to allow discharge to the venue listed below.       Follow Up Recommendations Home health PT;Supervision/Assistance - 24 hour    Equipment Recommendations  None recommended by PT    Recommendations for Other Services       Precautions / Restrictions Precautions Precautions: Fall Restrictions Weight Bearing Restrictions: No      Mobility  Bed Mobility Overal bed mobility: Needs Assistance Bed Mobility: Supine to Sit     Supine to sit: Supervision     General bed mobility comments: no assistnace    Transfers Overall transfer level: Needs assistance Equipment used: Rolling walker (2 wheeled) Transfers: Sit to/from Stand Sit to Stand: Min guard         General transfer comment: cues for hand placement  Ambulation/Gait Ambulation/Gait assistance: Min guard Gait Distance  (Feet): 120 Feet Assistive device: Rolling walker (2 wheeled) Gait Pattern/deviations: Step-through pattern Gait velocity: decr   General Gait Details: gait is steady, no balance loss.  Stairs            Wheelchair Mobility    Modified Rankin (Stroke Patients Only)       Balance Overall balance assessment: No apparent balance deficits (not formally assessed)                                           Pertinent Vitals/Pain Pain Assessment: No/denies pain    Home Living Family/patient expects to be discharged to:: Private residence (Simultaneous filing. User may not have seen previous data.) Living Arrangements: Alone (Simultaneous filing. User may not have seen previous data.) Available Help at Discharge: Family;Personal care attendant;Available 24 hours/day (Pt's son reports that he is working on increasing pt's help at home to almost 24/7 with PCAs and himself.  Simultaneous filing. User may not have seen previous data.) Type of Home: House (Simultaneous filing. User may not have seen previous data.) Home Access: Ramped entrance (Simultaneous filing. User may not have seen previous data.)     Home Layout: Two level;1/2 bath on main level;Bed/bath upstairs (Simultaneous filing. User may not have seen previous data.) Home Equipment: Grab bars - tub/shower;Wheelchair - [employer] - 2 wheels;Bedside commode (Simultaneous filing. User may not have seen previous data.) Additional Comments: Son reports WC is very wide as it belonged to his father.  Prior Function Level of Independence: Needs assistance (Simultaneous filing. User may not have seen previous data.)   Gait / Transfers Assistance Needed: Typically independent without use of AD. Pt denies falls in past year.  ADL's / Homemaking Assistance Needed: Pt is independent with BADLs and has a housekeeper once a week. Pt performs her own cooking and laundry.  Comments: Son provides transportation.      Hand Dominance   Dominant Hand: Right    Extremity/Trunk Assessment        Lower Extremity Assessment Lower Extremity Assessment: Generalized weakness    Cervical / Trunk Assessment Cervical / Trunk Assessment: Normal  Communication   Communication: No difficulties  Cognition Arousal/Alertness: Awake/alert Behavior During Therapy: WFL for tasks assessed/performed Overall Cognitive Status: Within Functional Limits for tasks assessed                                        General Comments      Exercises     Assessment/Plan    PT Assessment Patient needs continued PT services  PT Problem List Decreased strength;Decreased mobility;Decreased activity tolerance;Decreased knowledge of use of DME;Cardiopulmonary status limiting activity       PT Treatment Interventions DME instruction;Therapeutic activities;Gait training;Therapeutic exercise;Patient/family education;Functional mobility training    PT Goals (Current goals can be found in the Care Plan Huff)  Acute Rehab PT Goals Patient Stated Goal: go home PT Goal Formulation: With patient/family Time For Goal Achievement: 05/02/20 Potential to Achieve Goals: Good    Frequency Min 3X/week   Barriers to discharge        Co-evaluation PT/OT/SLP Co-Evaluation/Treatment: Yes Reason for Co-Treatment: For patient/therapist safety PT goals addressed during session: Mobility/safety with mobility OT goals addressed during session: ADL's and self-care       AM-PAC PT "6 Clicks" Mobility  Outcome Measure Help needed turning from your back to your side while in a flat bed without using bedrails?: None Help needed moving from lying on your back to sitting on the side of a flat bed without using bedrails?: None Help needed moving to and from a bed to a chair (including a wheelchair)?: A Little Help needed standing up from a chair using your arms (e.g., wheelchair or bedside chair)?: A Little Help  needed to walk in hospital room?: A Little Help needed climbing 3-5 steps with a railing? : A Lot 6 Click Score: 19    End of Session Equipment Utilized During Treatment: Gait belt Activity Tolerance: Patient tolerated treatment well Patient left: in chair;with call bell/phone within reach;with chair alarm set;with family/visitor present Nurse Communication: Mobility status PT Visit Diagnosis: Unsteadiness on feet (R26.81)    Time: 1308-6578 PT Time Calculation (min) (ACUTE ONLY): 20 min   Charges:   PT Evaluation $PT Eval Low Complexity: [place] Pager [phone] Office [phone]   Claretha Cooper 04/18/2020, 10:34 AM

## 2020-04-18 NOTE — Progress Notes (Signed)
3 Days Post-Op Subjective: Denies pain, denies nausea or emesis. Tolerating diet.  Objective: Vital signs in last 24 hours: Temp:  [98.4 F (36.9 C)-99.9 F (37.7 C)] 99.9 F (37.7 C) (04/04 0417) Pulse Rate:  [80-87] 80 (04/04 0417) Resp:  [16-20] 16 (04/04 0417) BP: (114-153)/(55-70) 130/55 (04/04 0417) SpO2:  [92 %-97 %] 94 % (04/04 0417)  Intake/Output from previous day: 04/03 0701 - 04/04 0700 In: 1200 [P.O.:1200] Out: 4600 [Urine:4600] Intake/Output this shift: No intake/output data recorded.   UOP: 4.6L dark pink  Physical Exam:  General: Alert and oriented CV: RRR Lungs: Clear Abdomen: Soft, ND, NT Ext: NT, No erythema  Lab Results: Recent Labs    04/16/20 1332 04/17/20 0307 04/18/20 0257  HGB 8.5* 8.2* 8.3*  HCT 27.4* 27.0* 27.3*   BMET Recent Labs    04/17/20 0307 04/18/20 0257  NA 140 138  K 4.0 3.4*  CL 109 104  CO2 24 26  GLUCOSE 105* 106*  BUN 26* 23  CREATININE 1.28* 1.23*  CALCIUM 8.3* 8.3*     Studies/Results: No results found.  Assessment/Plan: 1. Gross hematuria secondary to large bladder mass encompassing 75% bladder, likely at least clinical stage T3 bladder cancer, c/b right ureteral obstruction with moderate right hydronephrosis. Now s/p incomplete TURBT on 04/15/20. Unable to visualize right UO due to obstruction by large mass. Pathology pending. 24 Fr hematuria 3-way catheter in place. 2. Anemia. Has required tranfusion support during current hospitalization. Hgb appropriately responded to 1u pRBC on 4/2 and has remained stable today.  3. 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 Asymptomatic. Cr mildly elevated at ~1.2 but stable/slightly improvement .   -CBI was clamped early 4/3 with light pink tinge. Now dark pink/red.  -Hgb remains stable at 8.3 this AM after transfusion this weekend -Irrigates without clots. Will restart on slow drip CBI -Discussed options again with family and they would  like to proceed with comfort care -Appreciate hem/onc evaluation yesterday -When appropriate to discharge, she would like to see either Dr. Joya Gaskins or Titus Mould in [place] where she lives. -Will defer right nephrostomy tube placement given patient asymptomatic. Patient would prefer to transition to comfort care, so nephrostomy tube would only be indicated in the future if she were to develop symptoms associated with right hydronephrosis   LOS: 3 days   Matt R. Demosthenes Virnig MD 04/18/2020, 10:00 AM [facility]  Pager: [phone]

## 2020-04-18 NOTE — Progress Notes (Signed)
Patient ID: Sharon Huff, female   DOB: Jul 05, 1933, 87 y.o.   MRN: [id number]   Medical records reviewed   87 y.o. female  admitted on 04/14/2020 with with past medical history significant forhyperlipidemia who presented to a hospital in [facility] ED for evaluation of generalized weakness and bloody urine.  Patient states she has been feeling generally weak over the last 2 days. She lives alone and her son wanted to check in on her morning of 3/31. They noticed that her bedsheets were bloody. They went to the ED where she was found to have frank hematuria. Hemoglobin was 3.4. She was transfused 3 units PRBCs with subsequent hemoglobin improved to 8.4. CT abdomen/pelvis showed posterior wall bladder changes concerning for possible mass vs debris as well as severe right hydronephrosis.  Patient transferred to [facility].   Patient underwent cystoscopy that revealed large bladder mass encompassing 75% of her bladder.She had incomplete TURBT.Pathology pending. Per urology not a candidate for cystectomy but possible nephrostomy.  Patient and her family face treatment option decisions, advanced directive decisions and anticipatory care needs.  Patient and family appreciated discussion with Dr Burr Medico yesterday and have made decision to pursue cancer treatment.  Recommendation from oncology :  Recommendations/oncology: -Her outside CT abdomen pelvis showed posterior wall bladder as well as severe right hydronephrosis, no evidence of nodal or distant metastasis. -Patient felt to be not a candidate for surgery due to her advanced age. She has no systemic symptoms, and has good performance status. -I discussed the other treatment options, especially concurrent chemoradiation, or radiation alone, with small possibility of cure and preserve her bladder.  She does have mild AKI due to ureteral obstruction, which will make cisplatin chemotherapy challenge (plus her advanced age).  I do  think that she is a candidate for radiation therapy. -I also discussed the other option of systemic therapy, especially immunotherapy and or targeted therapy.  I would consider molecular testing targeted therapy. -After the above lengthy discussion, both patient and her family are interested in pursuing cancer treatment locally.  I will reach out to medical oncology and radiation oncology in [place], and refer her. -will f/u tomorrow before her discharge.   This NP visited patient at the bedside as a follow up to  yesterday's [place]. Son Braulio Conte at bedside.  Continued conversation regarding current medical situation; treatment option decisions, advanced directive decisions and anticipatory care needs.  Education offered on the importance of documentation of advanced care planning documents. Again addressed CODE STATUS and Encouraged patient to consider DNR/DNI status understanding evidenced based poor outcomes in similar hospitalized patient, as the cause of arrest is likely associated with advanced chronic illness rather than an easily reversible acute cardio-pulmonary event.  Education offered on importance of continued conversation with family and the medical providers regarding overall plan of care and treatment options,  ensuring decisions are within the context of the patients values and GOCs.  Patient and family anticipate discharge home in the next day or 2 once she is stable from a urology standpoint.  She hopes to have appointment secured with oncology before discharge.  At this time patient is open to all offered and available medical interventions to prolong life.  Questions and concerns addressed   Discussed with Dr Cyndia Skeeters  Total time spent on the unit was 35 minutes  Greater than 50% of the time was spent in counseling and coordination of care  Wadie Lessen NP  Palliative Medicine Team Team Phone # [phone] Pager  [phone]   

## 2020-04-18 NOTE — Progress Notes (Signed)
PROGRESS NOTE  Sharon Huff CBS:[id number] DOB: [birth year]   PCP: System, Provider Not In  Patient is from: Home  DOA: 04/14/2020 LOS: 3  Chief complaints: Hematuria and anemia  Brief Narrative / Interim history: 87 year old F with no significant PMH other than hyperlipidemia presented to [facility] ED with generalized weakness and hematuria, and found to have Hgb of 3.4 in the setting of gross hematuria.  CT abdomen and pelvis showed posterior wall bladder changes concerning for possible mass versus debris's as well as severe right hydronephrosis.  Transfused 3 units and Hgb improved to 8.4, and transferred here for further evaluation and treatment.  Patient underwent cystoscopy that revealed large bladder mass encompassing 75% of her bladder.  She had incomplete TURBT.  Pathology pending.  Per urology not a candidate for cystectomy but possible nephrostomy for palliation.  Urology suggested comfort care.  However, patient asking about other treatment options such as chemotherapy when she met with palliative medicine.  Oncology consulted and evaluated patient.  Oncology planning referral to local oncologist in [place] area.   Subjective: Seen and examined earlier this morning.  No major events overnight of this morning.  Dark red bloody urine from Foley catheter.  Urology restarted continuous bladder irrigation.  Hemoglobin stable.  Otherwise, patient has no complaints.  Denies chest pain, shortness of breath or GI symptoms.  Cough improved.  Patient son at bedside.  Objective: Vitals:   04/17/20 1100 04/17/20 1353 04/17/20 2012 04/18/20 0417  BP:  114/64 (!) 153/70 (!) 130/55  Pulse:  87 80 80  Resp:  _0 Temp:  98.4 F (36.9 C) 99 F (37.2 C) 99.9 F (37.7 C)  TempSrc:   Oral Oral  SpO2: 97% 92% 92% 94%  Weight:      Height:        Intake/Output Summary (Last 24 hours) at 04/18/2020 1417 Last data filed at 04/18/2020 1345 Gross per 24 hour  Intake 2480 ml  Output  5500 ml  Net -3020 ml   Filed Weights   04/16/20 0650  Weight: 60.2 kg    Examination:  GENERAL: No apparent distress.  Nontoxic. HEENT: MMM.  Vision and hearing grossly intact.  NECK: Supple.  No apparent JVD.  RESP: 94% on room air.  No IWOB.  Fair aeration bilaterally.  Some degree of kyphosis. CVS:  RRR. Heart sounds normal.  ABD/GI/GU: BS+. Abd soft, NTND.  Foley with dark red bloody urine MSK/EXT:  Moves extremities. No apparent deformity. No edema.  SKIN: no apparent skin lesion or wound NEURO: Awake, alert and oriented appropriately.  No apparent focal neuro deficit. PSYCH: Calm. Normal affect.   Procedures:  4/1-continuous bladder irrigation 4/1-cystoscopy/incomplete TURBT  Microbiology summarized: [id number] PCR pending Urine culture pending  Assessment & Plan: ABLA due to gross hematuria due to bladder mass: Hgb 3.4 at OSH>3u>8.4>>6.8>1u>>8.2> 8.3.  Unknown baseline. Recent Labs    04/15/20 0319 04/15/20 0906 04/16/20 0311 04/16/20 1332 04/17/20 0307 04/18/20 0257  HGB 7.5* 7.5* 6.8* 8.5* 8.2* 8.3*  -Continue monitoring H&H  Large bladder mass encompassing about 75% of the bladder concerning for at least stage III bladder cancer Posterior bladder mass with moderate right hydronephrosis -Status post cystoscopy and partial TURBT.  Surgical pathology results pending. -Per urology, not a candidate for cystectomy given the stage and stage and suggested comfort care -Oncology consulted and to place referral to local oncologist in [place], [place]. -Appreciate input by palliative medicine  Acute diastolic CHF: New diagnosis.  TTE with LVEF  of 60 to 65%, G1 DD moderate LAE and RVSP of 42.6 mmHg.  Elevated BNP to 833 in the setting of IV fluid and blood transfusion.  CXR with cardiomegaly and vascular congestion.  Received IV Lasix 40 mg x 1 -Switch to p.o. Lasix 20 mg daily. -Difficult to monitor urine output due to continuous bladder irrigation. -Monitor renal  functions and electrolytes.  AKI/azotemia: Unclear baseline.  Due to obstructive uropathy.  Renal US with somewhat atrophic right kidney and moderate hydronephrosis but normal echogenicity.  Improving Recent Labs    04/15/20 0319 04/15/20 0901 04/16/20 0311 04/17/20 0307 04/18/20 0257  BUN 25* 23 25* 26* 23  CREATININE 1.51* 1.31* 1.33* 1.28* 1.23*  -Monitor renal function -Avoid nephrotoxic meds  Hypokalemia: K3.4.  Mg 1.8. -Replenish and recheck  Elevated LFT: Pattern consistent with EtOH or rhabdo.  CK mildly elevated to 348.  Congestive hepatopathy?  No GI symptoms.  Acute hepatitis panel negative.  Resolving. -Check acute hepatitis panel in the morning -Continue monitoring  Leukocytosis/thrombocytosis-improved.  No fever.  Procalcitonin down to normal without antibiotics.  Urine culture negative.  Due to malignancy? -Continue monitoring   Debility/generalized weakness -Therapy recommended home health    Body mass index is 24.27 kg/m.         DVT prophylaxis:  SCDs Start: 04/15/20 0040  Code Status: Full code Family Communication: Updated patient's son at bedside. Level of care: Telemetry  Status is: Inpatient  Remains inpatient appropriate because:Persistent severe electrolyte disturbances, Ongoing diagnostic testing needed not appropriate for outpatient work up and Inpatient level of care appropriate due to severity of illness   Dispo: The patient is from: Home              Anticipated d/c is to: Home              Patient currently is not medically stable to d/c.   Difficult to place patient No            Consultants:  Urology Palliative medicine Oncology   Sch Meds:  Scheduled Meds: . Chlorhexidine Gluconate Cloth  6 each Topical Daily  . docusate sodium  100 mg Oral Daily  . furosemide  20 mg Oral Daily   Continuous Infusions:  PRN Meds:.acetaminophen **OR** acetaminophen, guaiFENesin-dextromethorphan, ipratropium-albuterol, lip  balm, ondansetron **OR** ondansetron (ZOFRAN) IV, polyethylene glycol, senna-docusate  Antimicrobials: Anti-infectives (From admission, onward)   Start     Dose/Rate Route Frequency Ordered Stop   04/15/20 1433  ceFAZolin (ANCEF) 2-4 GM/100ML-% IVPB       Note to Pharmacy: Herbie Drape   : cabinet override      04/15/20 1433 04/16/20 0244       I have personally reviewed the following labs and images: CBC: Recent Labs  Lab 04/15/20 0319 04/15/20 0906 04/16/20 0311 04/16/20 1332 04/17/20 0307 04/18/20 0257  WBC 14.4* 15.8* 14.6*  --  13.3* 11.2*  NEUTROABS  --   --  12.9*  --   --   --   HGB 7.5* 7.5* 6.8* 8.5* 8.2* 8.3*  HCT 24.4* 24.7* 22.8* 27.4* 27.0* 27.3*  MCV 83.8 84.9 86.7  --  88.8 88.3  PLT 519* 498* 422*  --  381 407*   BMP &GFR Recent Labs  Lab 04/15/20 0319 04/15/20 0901 04/16/20 0311 04/17/20 0307 04/18/20 0257  NA 142 140 138 140 138  K 4.2 4.2 4.6 4.0 3.4*  CL 113* 112* 109 109 104  CO2 _0 GLUCOSE 120*  106* 139* 105* 106*  BUN 25* 23 25* 26* 23  CREATININE 1.51* 1.31* 1.33* 1.28* 1.23*  CALCIUM 8.5* 8.0* 8.1* 8.3* 8.3*  MG  --  2.0 1.9 1.9 1.8  PHOS  --   --   --  3.3 3.5   Estimated Creatinine Clearance: 29.1 mL/min (A) (by C-G formula based on SCr of 1.23 mg/dL (H)). Liver & Pancreas: Recent Labs  Lab 04/15/20 0319 04/15/20 0901 04/16/20 0311 04/17/20 0307 04/18/20 0257  AST 214* 170* 88*  --  36  ALT 187* 171* 119*  --  51*  ALKPHOS 60 55 58  --  65  BILITOT 1.2 1.1 0.7  --  0.7  PROT 5.9* 5.5* 5.1*  --  5.1*  ALBUMIN 2.8* 2.8* 2.4* 2.5* 2.4*   No results for input(s): LIPASE, AMYLASE in the last 168 hours. No results for input(s): AMMONIA in the last 168 hours. Diabetic: No results for input(s): HGBA1C in the last 72 hours. No results for input(s): GLUCAP in the last 168 hours. Cardiac Enzymes: Recent Labs  Lab 04/15/20 0319  CKTOTAL 348*   No results for input(s): PROBNP in the last 8760 hours. Coagulation  Profile: Recent Labs  Lab 04/15/20 0319  INR 1.2   Thyroid Function Tests: No results for input(s): TSH, T4TOTAL, FREET4, T3FREE, THYROIDAB in the last 72 hours. Lipid Profile: No results for input(s): CHOL, HDL, LDLCALC, TRIG, CHOLHDL, LDLDIRECT in the last 72 hours. Anemia Panel: No results for input(s): VITAMINB12, FOLATE, FERRITIN, TIBC, IRON, RETICCTPCT in the last 72 hours. Urine analysis:    Component Value Date/Time   COLORURINE RED (A) 04/17/2020 0608   APPEARANCEUR TURBID (A) 04/17/2020 0608   LABSPEC  04/17/2020 0608    TEST NOT REPORTED DUE TO COLOR INTERFERENCE OF URINE PIGMENT   PHURINE  04/17/2020 1610    TEST NOT REPORTED DUE TO COLOR INTERFERENCE OF URINE PIGMENT   GLUCOSEU (A) 04/17/2020 0608    TEST NOT REPORTED DUE TO COLOR INTERFERENCE OF URINE PIGMENT   HGBUR (A) 04/17/2020 0608    TEST NOT REPORTED DUE TO COLOR INTERFERENCE OF URINE PIGMENT   BILIRUBINUR (A) 04/17/2020 0608    TEST NOT REPORTED DUE TO COLOR INTERFERENCE OF URINE PIGMENT   KETONESUR (A) 04/17/2020 0608    TEST NOT REPORTED DUE TO COLOR INTERFERENCE OF URINE PIGMENT   PROTEINUR (A) 04/17/2020 0608    TEST NOT REPORTED DUE TO COLOR INTERFERENCE OF URINE PIGMENT   NITRITE (A) 04/17/2020 0608    TEST NOT REPORTED DUE TO COLOR INTERFERENCE OF URINE PIGMENT   LEUKOCYTESUR (A) 04/17/2020 0608    TEST NOT REPORTED DUE TO COLOR INTERFERENCE OF URINE PIGMENT   Sepsis Labs: Invalid input(s): PROCALCITONIN, [place]  Microbiology: Recent Results (from the past 240 hour(s))  Culture, Urine     Status: None   Collection Time: 04/17/20  6:08 AM   Specimen: Urine, Random  Result Value Ref Range Status   Specimen Description   Final    URINE, RANDOM Performed at [facility] [address] [place], [place] [address]    Special Requests   Final    NONE Performed at [facility], [place] [address], [place], [place] [address]    Culture   Final    NO  GROWTH Performed at [facility], [place] [address] [place], [place] [address]    Report Status 04/18/2020 FINAL  Final    Radiology Studies: No results found.   Bretta Bang  Bettina Gavia [facility]  If 7PM-7AM, please contact night-coverage [email or web address] 04/18/2020, 2:17 PM

## 2020-04-18 NOTE — Progress Notes (Addendum)
[place]  Telephone:([phone]) [facility] INPATIENT PROGRESS NOTE  Sharon Huff  DOB: 10-18-1933  MR#: [id number]  CSN#: [id number]    Requesting Physician: [facility] Dr. Cyndia Skeeters   Patient Care Team: System, Provider Not In as PCP - General  Subjective:   The patient is having increased hematuria today.  Bladder irrigation has restarted.  She offers no complaints to me today.  Her son is at the bedside.  MEDICAL HISTORY:  Past Medical History:  Diagnosis Date  . Hyperlipidemia     SURGICAL HISTORY: Past Surgical History:  Procedure Laterality Date  . HEMORRHOID SURGERY    . TRANSURETHRAL RESECTION OF BLADDER TUMOR N/A 04/15/2020   Procedure: TRANSURETHRAL RESECTION OF BLADDER TUMOR (TURBT) ;  Surgeon: Janith Lima, MD;  Location: [facility];  Service: Urology;  Laterality: N/A;    SOCIAL HISTORY: Social History   Socioeconomic History  . Marital status: Widowed    Spouse name: Not on file  . Number of children: Not on file  . Years of education: Not on file  . Highest education level: Not on file  Occupational History  . Not on file  Tobacco Use  . Smoking status: Never Smoker  . Smokeless tobacco: Never Used  Substance and Sexual Activity  . Alcohol use: Never  . Drug use: Never  . Sexual activity: Not on file  Other Topics Concern  . Not on file  Social History Narrative  . Not on file   Social Determinants of Health   Financial Resource Strain: Not on file  Food Insecurity: Not on file  Transportation Needs: Not on file  Physical Activity: Not on file  Stress: Not on file  Social Connections: Not on file  Intimate Partner Violence: Not on file    FAMILY HISTORY: Family History  Problem Relation Age of Onset  . Heart disease Mother   . Heart disease Father     ALLERGIES:  is allergic to other.  MEDICATIONS:  Current Facility-Administered Medications  Medication Dose Route Frequency Provider Last Rate  Last Admin  . acetaminophen (TYLENOL) tablet 650 mg  650 mg Oral Q6H PRN Lenore Cordia, MD   650 mg at 04/18/20 0424   Or  . acetaminophen (TYLENOL) suppository 650 mg  650 mg Rectal Q6H PRN Lenore Cordia, MD      . Chlorhexidine Gluconate Cloth 2 % PADS 6 each  6 each Topical Daily Mercy Riding, MD   6 each at 04/18/20 0839  . docusate sodium (COLACE) capsule 100 mg  100 mg Oral Daily Wendee Beavers T, MD   100 mg at 04/18/20 0838  . furosemide (LASIX) tablet 20 mg  20 mg Oral Daily Wendee Beavers T, MD   20 mg at 04/18/20 0839  . guaiFENesin-dextromethorphan (ROBITUSSIN DM) 100-10 MG/5ML syrup 5 mL  5 mL Oral Q4H PRN Lenore Cordia, MD   5 mL at 04/15/20 0140  . ipratropium-albuterol (DUONEB) 0.5-2.5 (3) MG/3ML nebulizer solution 3 mL  3 mL Nebulization Q6H PRN Gonfa, Taye T, MD      . lip balm (CARMEX) ointment 1 application  1 application Topical PRN Gonfa, Taye T, MD      . ondansetron (ZOFRAN) tablet 4 mg  4 mg Oral Q6H PRN Lenore Cordia, MD       Or  . ondansetron (ZOFRAN) injection 4 mg  4 mg Intravenous Q6H PRN Lenore Cordia, MD   4 mg at 04/15/20  0246  . polyethylene glycol (MIRALAX / GLYCOLAX) packet 17 g  17 g Oral BID PRN Gonfa, Taye T, MD      . potassium chloride SA (KLOR-CON) CR tablet 40 mEq  40 mEq Oral Q3H Wendee Beavers T, MD   40 mEq at 04/18/20 0839  . senna-docusate (Senokot-S) tablet 1 tablet  1 tablet Oral BID PRN Mercy Riding, MD        REVIEW OF SYSTEMS:   Constitutional: Denies fevers, chills or abnormal night sweats Eyes: Denies blurriness of vision, double vision or watery eyes Ears, nose, mouth, throat, and face: Denies mucositis or sore throat Respiratory: Denies cough, dyspnea or wheezes Cardiovascular: Denies palpitation, chest discomfort or lower extremity swelling Gastrointestinal:  Denies nausea, heartburn or change in bowel habits Skin: Denies abnormal skin rashes Lymphatics: Denies new lymphadenopathy or easy bruising Neurological:Denies numbness,  tingling or new weaknesses Behavioral/Psych: Mood is stable, no new changes  All other systems were reviewed with the patient and are negative.  PHYSICAL EXAMINATION: ECOG PERFORMANCE STATUS: 1 - Symptomatic but completely ambulatory  Vitals:   04/17/20 2012 04/18/20 0417  BP: (!) 153/70 (!) 130/55  Pulse: 80 80  Resp: 20 16  Temp: 99 F (37.2 C) 99.9 F (37.7 C)  SpO2: 92% 94%   Filed Weights   04/16/20 0650  Weight: 60.2 kg    GENERAL:alert, no distress and comfortable SKIN: skin color, texture, turgor are normal, no rashes or significant lesions EYES: normal, conjunctiva are pink and non-injected, sclera clear OROPHARYNX:no exudate, no erythema and lips, buccal mucosa, and tongue normal  NECK: supple, thyroid normal size, non-tender, without nodularity LYMPH:  no palpable lymphadenopathy in the cervical, axillary or inguinal LUNGS: clear to auscultation and percussion with normal breathing effort HEART: regular rate & rhythm and no murmurs and no lower extremity edema ABDOMEN:abdomen soft, non-tender and normal bowel sounds, (+) foley in place with bloody urine  Musculoskeletal:no cyanosis of digits and no clubbing  PSYCH: alert & oriented x 3 with fluent speech NEURO: no focal motor/sensory deficits  LABORATORY DATA:  I have reviewed the data as listed Lab Results  Component Value Date   WBC 11.2 (H) 04/18/2020   HGB 8.3 (L) 04/18/2020   HCT 27.3 (L) 04/18/2020   MCV 88.3 04/18/2020   PLT 407 (H) 04/18/2020   Recent Labs    04/15/20 0901 04/16/20 0311 04/17/20 0307 04/18/20 0257  NA 140 138 140 138  K 4.2 4.6 4.0 3.4*  CL 112* 109 109 104  CO2 23 23 24 26   GLUCOSE 106* 139* 105* 106*  BUN 23 25* 26* 23  CREATININE 1.31* 1.33* 1.28* 1.23*  CALCIUM 8.0* 8.1* 8.3* 8.3*  GFRNONAA 40* 39* 41* 43*  PROT 5.5* 5.1*  --  5.1*  ALBUMIN 2.8* 2.4* 2.5* 2.4*  AST 170* 88*  --  36  ALT 171* 119*  --  51*  ALKPHOS 55 58  --  65  BILITOT 1.1 0.7  --  0.7     RADIOGRAPHIC STUDIES: I have personally reviewed the radiological images as listed and agreed with the findings in the report. US RENAL  Result Date: 04/15/2020 CLINICAL DATA:  Gross hematuria EXAM: RENAL / URINARY TRACT ULTRASOUND COMPLETE COMPARISON:  None. FINDINGS: Right Kidney: Renal measurements: 8.8 x 4.1 x 6.8 cm = volume: 128 mL. Echogenicity within normal limits. No mass. Moderate hydronephrosis. Left Kidney: Renal measurements: 10.0 x 4.6 x 4.8 = volume: 117 mL. Echogenicity within normal limits. No mass or  hydronephrosis visualized. Bladder: The collapsed around Foley catheter. Other: None IMPRESSION: Moderate right hydronephrosis indicative of ureteral obstruction which may be due to stone, stricture, or mass. Electronically Signed   By: Miachel Roux M.D.   On: 04/15/2020 07:59   DG Chest Port 1 View  Result Date: 04/15/2020 CLINICAL DATA:  Cough, weakness, lethargy EXAM: PORTABLE CHEST 1 VIEW COMPARISON:  None. FINDINGS: Cardiomegaly with mild vascular congestion and nonspecific slight interstitial prominence. Minor basilar atelectasis. No large effusion or pneumothorax. Bones are osteopenic. Atherosclerosis of the aorta. Scoliosis of the spine noted. IMPRESSION: Cardiomegaly with vascular congestion and basilar atelectasis. Electronically Signed   By: Jerilynn Mages.  Shick M.D.   On: 04/15/2020 10:20   ECHOCARDIOGRAM COMPLETE  Result Date: 04/16/2020    ECHOCARDIOGRAM REPORT   Patient Name:   Sharon Huff Date of Exam: 04/16/2020 Medical Rec #:  [id number]       Height:       62.0 in Accession #:    [id number]      Weight:       132.7 lb Date of Birth:  06-09-1933       BSA:          1.606 m Patient Age:    87 years        BP:           156/82 mmHg Patient Gender: F               HR:           86 bpm. Exam Location:  Inpatient Procedure: 2D Echo, Cardiac Doppler and Color Doppler Indications:    I50.23 Acute on chronic systolic (congestive) heart failure  History:        Patient has no prior  history of Echocardiogram examinations.                 Risk Factors:Dyslipidemia.  Sonographer:    Tiffany Dance Referring Phys: [id number] Charlesetta Ivory GONFA IMPRESSIONS  1. Left ventricular ejection fraction, by estimation, is 60 to 65%. The left ventricle has normal function. The left ventricle has no regional wall motion abnormalities. There is mild left ventricular hypertrophy. Left ventricular diastolic parameters are consistent with Grade I diastolic dysfunction (impaired relaxation).  2. Right ventricular systolic function is normal. The right ventricular size is normal. There is mildly elevated pulmonary artery systolic pressure. The estimated right ventricular systolic pressure is 40.1 mmHg.  3. Left atrial size was moderately dilated.  4. Right atrial size was mildly dilated.  5. The mitral valve is normal in structure. Mild mitral valve regurgitation. No evidence of mitral stenosis.  6. The aortic valve is normal in structure. Aortic valve regurgitation is not visualized. No aortic stenosis is present.  7. The inferior vena cava is normal in size with greater than 50% respiratory variability, suggesting right atrial pressure of 3 mmHg. FINDINGS  Left Ventricle: Left ventricular ejection fraction, by estimation, is 60 to 65%. The left ventricle has normal function. The left ventricle has no regional wall motion abnormalities. The left ventricular internal cavity size was normal in size. There is  mild left ventricular hypertrophy. Left ventricular diastolic parameters are consistent with Grade I diastolic dysfunction (impaired relaxation). Right Ventricle: The right ventricular size is normal. No increase in right ventricular wall thickness. Right ventricular systolic function is normal. There is mildly elevated pulmonary artery systolic pressure. The tricuspid regurgitant velocity is 2.94  m/s, and with an assumed right atrial pressure of 8 mmHg, the estimated  right ventricular systolic pressure is 16.1 mmHg.  Left Atrium: Left atrial size was moderately dilated. Right Atrium: Right atrial size was mildly dilated. Pericardium: There is no evidence of pericardial effusion. Mitral Valve: The mitral valve is normal in structure. Mild mitral valve regurgitation. No evidence of mitral valve stenosis. Tricuspid Valve: The tricuspid valve is normal in structure. Tricuspid valve regurgitation is trivial. No evidence of tricuspid stenosis. Aortic Valve: The aortic valve is normal in structure. Aortic valve regurgitation is not visualized. No aortic stenosis is present. Pulmonic Valve: The pulmonic valve was normal in structure. Pulmonic valve regurgitation is not visualized. No evidence of pulmonic stenosis. Aorta: The aortic root is normal in size and structure. Venous: The inferior vena cava is normal in size with greater than 50% respiratory variability, suggesting right atrial pressure of 3 mmHg. IAS/Shunts: No atrial level shunt detected by color flow Doppler.  LEFT VENTRICLE PLAX 2D LVIDd:         3.70 cm  Diastology LVIDs:         2.50 cm  LV e' medial:    9.25 cm/s LV PW:         1.20 cm  LV E/e' medial:  8.8 LV IVS:        0.80 cm  LV e' lateral:   10.60 cm/s LVOT diam:     2.00 cm  LV E/e' lateral: 7.7 LV SV:         80 LV SV Index:   50 LVOT Area:     3.14 cm  RIGHT VENTRICLE             IVC RV Basal diam:  3.00 cm     IVC diam: 2.20 cm RV Mid diam:    1.70 cm RV S prime:     12.60 cm/s TAPSE (M-mode): 2.3 cm LEFT ATRIUM             Index       RIGHT ATRIUM           Index LA diam:        3.00 cm 1.87 cm/m  RA Area:     16.10 cm LA Vol (A2C):   75.0 ml 46.70 ml/m RA Volume:   45.10 ml  28.08 ml/m LA Vol (A4C):   68.8 ml 42.84 ml/m LA Biplane Vol: 72.6 ml 45.21 ml/m  AORTIC VALVE LVOT Vmax:   115.00 cm/s LVOT Vmean:  79.800 cm/s LVOT VTI:    0.254 m  AORTA Ao Root diam: 3.40 cm Ao Asc diam:  3.60 cm MITRAL VALVE                TRICUSPID VALVE MV Area (PHT): 2.95 cm     TR Peak grad:   34.6 mmHg MV Decel Time: 257  msec     TR Vmax:        294.00 cm/s MV E velocity: 81.20 cm/s MV A velocity: 107.00 cm/s  SHUNTS MV E/A ratio:  0.76         Systemic VTI:  0.25 m                             Systemic Diam: 2.00 cm Candee Furbish MD Electronically signed by Candee Furbish MD Signature Date/Time: 04/16/2020/1:11:02 PM    Final     ASSESSMENT & PLAN:  87 year old Caucasian female, without significant past medical history, presented with hematuria and severe anemia  1.  Newly diagnosed bladder cancer, at least T3, s/p incomplete TURBT, not a surgical candidate  2.  Severe anemia secondary to hematuria, status post blood transfusion 3u 3.  Right hydronephrosis secondary to tumor obstruction  Recommendations: -Her outside CT abdomen pelvis showed posterior wall bladder as well as severe right hydronephrosis, no evidence of nodal or distant metastasis. -Patient felt to be not a candidate for surgery due to her advanced age. She has no systemic symptoms, and has good performance status. -Other treatment options have been discussed with the patient including concurrent chemoradiation or radiation alone with small possibility of cure and preservation of her bladder.  She has mild AKI due to ureteral obstruction which makes giving cisplatin chemotherapy a challenge.  We do think that she is a candidate for radiation therapy. -Additionally, we discussed other systemic treatment options including immunotherapy or targeted therapy.  Will request molecular testing once biopsy results finalized. -The patient lives in [place], [place].  She prefers to be evaluated by oncology closer to home.  We will refer her to [facility] to both medical oncology and radiation oncology.  Once final pathology results available, will fax records to medical oncology [phone]) and to radiation oncology [phone]).   All questions were answered. The patient knows to call the clinic with any problems, questions or concerns.    Mikey Bussing,  NP 04/18/2020 11:10 AM  Addendum  I have seen the patient, examined her. I agree with the assessment and and plan and have edited the notes.   Her surgical pathology still pending, if it confirms bladder cancer, I recommend concurrent chemoradiation at her local [facility]. We will make the referral when path is back. We give her son the med/onc and rad/onc physicians' name and contact info at [facility] today.  Truitt Merle  04/18/2020

## 2020-04-18 NOTE — Progress Notes (Signed)
Foley irrigated with no blood clots noted.  CBI restarted at this time

## 2020-04-18 NOTE — Progress Notes (Signed)
Pt continues to produce dark red urine with no clots. Urine is flowing freely, no discomfort or bladder distention noted. Bed pad changed 3 times due to catheter leaking, balloon checked and was inflated with correct size of fluid. Will endorse to continue to monitor and report any abnormal findings.

## 2020-04-18 NOTE — Care Management Important Message (Signed)
Important Message  Patient Details IM Letter given to the Patient. Name: Chace Bisch MRN: [id number] Date of Birth: 15-Apr-1933   Medicare Important Message Given:  Yes     Kerin Salen 04/18/2020, 10:50 AM

## 2020-04-19 ENCOUNTER — Other Ambulatory Visit: Payer: Self-pay | Admitting: Nurse Practitioner

## 2020-04-19 DIAGNOSIS — C679 Malignant neoplasm of bladder, unspecified: Secondary | ICD-10-CM

## 2020-04-19 DIAGNOSIS — N179 Acute kidney failure, unspecified: Secondary | ICD-10-CM | POA: Diagnosis present

## 2020-04-19 DIAGNOSIS — D62 Acute posthemorrhagic anemia: Secondary | ICD-10-CM | POA: Diagnosis not present

## 2020-04-19 DIAGNOSIS — I5032 Chronic diastolic (congestive) heart failure: Secondary | ICD-10-CM

## 2020-04-19 DIAGNOSIS — I5031 Acute diastolic (congestive) heart failure: Secondary | ICD-10-CM | POA: Diagnosis present

## 2020-04-19 DIAGNOSIS — N3289 Other specified disorders of bladder: Secondary | ICD-10-CM | POA: Diagnosis not present

## 2020-04-19 DIAGNOSIS — R310 Gross hematuria: Secondary | ICD-10-CM | POA: Diagnosis not present

## 2020-04-19 LAB — PHOSPHORUS: Phosphorus: 3.7 mg/dL (ref 2.5–4.6)

## 2020-04-19 LAB — CBC
HCT: 26.7 % — ABNORMAL LOW (ref 36.0–46.0)
Hemoglobin: 8 g/dL — ABNORMAL LOW (ref 12.0–15.0)
MCH: 26.7 pg (ref 26.0–34.0)
MCHC: 30 g/dL (ref 30.0–36.0)
MCV: 89 fL (ref 80.0–100.0)
Platelets: 380 10*3/uL (ref 150–400)
RBC: 3 MIL/uL — ABNORMAL LOW (ref 3.87–5.11)
RDW: 18.4 % — ABNORMAL HIGH (ref 11.5–15.5)
WBC: 9.7 10*3/uL (ref 4.0–10.5)
nRBC: 0 % (ref 0.0–0.2)

## 2020-04-19 LAB — COMPREHENSIVE METABOLIC PANEL
ALT: 37 U/L (ref 0–44)
AST: 25 U/L (ref 15–41)
Albumin: 2.5 g/dL — ABNORMAL LOW (ref 3.5–5.0)
Alkaline Phosphatase: 59 U/L (ref 38–126)
Anion gap: 7 (ref 5–15)
BUN: 17 mg/dL (ref 8–23)
CO2: 29 mmol/L (ref 22–32)
Calcium: 8.4 mg/dL — ABNORMAL LOW (ref 8.9–10.3)
Chloride: 104 mmol/L (ref 98–111)
Creatinine, Ser: 1.17 mg/dL — ABNORMAL HIGH (ref 0.44–1.00)
GFR, Estimated: 46 mL/min — ABNORMAL LOW (ref >60)
Glucose, Bld: 146 mg/dL — ABNORMAL HIGH (ref 70–99)
Potassium: 3.5 mmol/L (ref 3.5–5.1)
Sodium: 140 mmol/L (ref 135–145)
Total Bilirubin: 0.4 mg/dL (ref 0.3–1.2)
Total Protein: 5.1 g/dL — ABNORMAL LOW (ref 6.5–8.1)

## 2020-04-19 LAB — SURGICAL PATHOLOGY

## 2020-04-19 LAB — CK: Total CK: 49 U/L (ref 38–234)

## 2020-04-19 LAB — MAGNESIUM: Magnesium: 1.9 mg/dL (ref 1.7–2.4)

## 2020-04-19 MED ORDER — FUROSEMIDE 20 MG PO TABS: 20.0000 mg | ORAL_TABLET | Freq: Every day | ORAL | 1 refills | Status: AC

## 2020-04-19 MED ORDER — ONDANSETRON HCL 4 MG PO TABS: 4.0000 mg | ORAL_TABLET | Freq: Three times a day (TID) | ORAL | 0 refills | Status: AC | PRN

## 2020-04-19 MED ORDER — METOCLOPRAMIDE HCL 5 MG/ML IJ SOLN
5.0000 mg | Freq: Once | INTRAMUSCULAR | Status: AC
  Administered 2020-04-19: 5 mg via INTRAVENOUS
  Filled 2020-04-19: qty 2

## 2020-04-19 NOTE — Progress Notes (Addendum)
4 Days Post-Op Subjective: Denies pain. No flank pain. Tolerating foley. Afebrile.  Objective: Vital signs in last 24 hours: Temp:  [97.8 F (36.6 C)-99 F (37.2 C)] 98.4 F (36.9 C) (04/05 0358) Pulse Rate:  [76-84] 80 (04/05 0358) Resp:  [16-20] 20 (04/05 0358) BP: (129-148)/(56-70) 129/56 (04/05 0358) SpO2:  [90 %-94 %] 94 % (04/05 0358)  Intake/Output from previous day: 04/04 0701 - 04/05 0700 In: 6850  Out: 9800 [Urine:9800] Intake/Output this shift: No intake/output data recorded.   UOP: 9.8 (CBI)  Physical Exam:  General: Alert and oriented CV: RRR Lungs: Clear Abdomen: Soft, ND, NT Ext: NT, No erythema  Lab Results: Recent Labs    04/17/20 0307 04/18/20 0257 04/19/20 0350  HGB 8.2* 8.3* 8.0*  HCT 27.0* 27.3* 26.7*   BMET Recent Labs    04/18/20 0257 04/19/20 0350  NA 138 140  K 3.4* 3.5  CL 104 104  CO2 26 29  GLUCOSE 106* 146*  BUN 23 17  CREATININE 1.23* 1.17*  CALCIUM 8.3* 8.4*     Studies/Results: No results found.  Assessment/Plan: 1. Gross hematuria secondary to large bladder mass encompassing 75% bladder, likely at least clinical stage T3 bladder cancer, c/b right ureteral obstruction with moderate right hydronephrosis. Now s/p incomplete TURBT on 04/15/20. Unable to visualize right UO due to obstruction by large mass. Pathology pending. 24 Fr hematuria 3-way catheter in place. 2. Anemia. Has required tranfusion support during current hospitalization.Hgb appropriately responded to 1u pRBC on 4/2 and has remained stable today.  3. 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 Asymptomatic. Cr mildly elevated at ~1.2 but stable/slightly improvement.  4. 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 Restarted yesterday and light pink. Clamped all night.  -Irrigates without clots. Slight pink  tinge. -Hgb stable at 8.0 -Appropriate for void trial this AM. -Reviewed pathology with patient's son today -Family and patient would like to proceed with comfort care -Appreciate hem/onc evaluation -When appropriate to discharge, she would like to see either Dr. Joya Gaskins or Titus Mould in [place] at [facility] where she lives. Her son has already reached out to that practice -Will defer right nephrostomy tube placement given patient asymptomatic.Patient would prefer to transition to comfort care, so nephrostomy tube would only be indicated in the future if she were to develop symptoms associated with right hydronephrosis -Advised patient to return to ED if significant hematuria, inability to urinate or feeling weak -All questions answered to their satisfaction   LOS: 4 days    Matt R. Korie Brabson MD 04/19/2020, 7:46 AM [facility]  Pager: [phone]

## 2020-04-19 NOTE — Discharge Summary (Signed)
Physician Discharge Summary  Sharon Huff IRS:[id number] DOB: Dec 20, 1933 DOA: 04/14/2020  PCP: System, Provider Not In  Admit date: 04/14/2020 Discharge date: 04/19/2020  Admitted From: Home Disposition: Home  Recommendations for Outpatient Follow-up:  1. Patient has been encouraged to establish care locally 2. Urology to refer patient to urologist in [place], [place] 3. Oncology to refer patient to oncologist in [place], [place] 4. Please obtain CBC/CMP/Mag at follow up 5. Please follow up on the following pending results: Bladder mass pathology  Home Health: PT/OT Equipment/Devices: None recommended  Discharge Condition: Stable CODE STATUS: Full code   Follow-up [facility] Follow up.   Why: This is the agency that will be providing physical therapy in the home Contact information: [place] [facility] Course: 87 year old F with no significant PMH other than hyperlipidemia presented to [facility] ED with generalized weakness and hematuria, and found to have Hgb of 3.4 in the setting of gross hematuria.  CT abdomen and pelvis showed posterior wall bladder changes concerning for possible mass versus debris's as well as severe right hydronephrosis.  Transfused 3 units and Hgb improved to 8.4, and transferred here for further evaluation and treatment.  Patient underwent cystoscopy that revealed large bladder mass encompassing 75% of her bladder.  She had incomplete TURBT.  Pathology pending.  Per urology not a candidate for cystectomy but possible nephrostomy for palliation if symptomatic.  Hemoglobin dropped to 6.8.  She received 1 more unit with appropriate response.  Eventually, hematuria subsided and hemoglobin stayed stable.   Patient was also evaluated by oncology and palliative medicine.  Both urology and oncology to refer patient to respective experts in [place], [place] close to patient's home.   See individual  problem list below for more on hospital course.   Discharge Diagnoses:  ABLA due to gross hematuria due to bladder mass: Hgb 3.4 at OSH>3u>8.4>>6.8>1u>>8.2> 8.3.  Unknown baseline.         Recent Labs    04/15/20 0319 04/15/20 0906 04/16/20 0311 04/16/20 1332 04/17/20 0307 04/18/20 0257  HGB 7.5* 7.5* 6.8* 8.5* 8.2* 8.3*  -Recheck CBC at follow-up  Large bladder mass encompassing about 75% of the bladder concerning for at least stage III bladder cancer Posterior bladder mass with moderate right hydronephrosis -Cystoscopy and partial TURBT on 05/04/2020.  Surgical pathology results pending. -Per urology, not a candidate for cystectomy given the stage and stage and suggested comfort care but to refer patient to urologist in [place], [place]. -Oncology consulted and to place referral to local oncologist in [place], [place] after pathology result. -Appreciate input by palliative medicine-full code with full scope of care -Passed voiding trial prior to discharge  Acute diastolic CHF: New diagnosis.  TTE with LVEF of 60 to 65%, G1 DD moderate LAE and RVSP of 42.6 mmHg.  Elevated BNP to 833 in the setting of IV fluid and blood transfusion.  CXR with cardiomegaly and vascular congestion.  Started on IV Lasix and transition to p.o. Lasix with improvement in her breathing and cough.  Positive ambulatory saturation assessment -Discharged on p.o. Lasix 20 mg daily  AKI/azotemia: Unclear baseline.  Due to obstructive uropathy.  Renal US with somewhat atrophic right kidney and moderate hydronephrosis but normal echogenicity.  Improving Recent Labs    04/15/20 0319 04/15/20 0901 04/16/20 0311 04/17/20 0307 04/18/20 0257 04/19/20 0350  BUN 25* 23 25* 26* 23 17  CREATININE 1.51* 1.31*  1.33* 1.28* 1.23* 1.17*  -Check renal function at follow-up.  Hypokalemia: Resolved.  Elevated LFT: Pattern consistent with EtOH or rhabdo.  CK mildly elevated to 348.  Congestive hepatopathy?  No GI  symptoms.  Acute hepatitis panel negative.  Resolved.  Leukocytosis/thrombocytosis-resolved without antibiotics.  Debility/generalized weakness -Home health PT/OT  Family communication: Discussed with patient's son at bedside   Body mass index is 24.27 kg/m.            Discharge Exam: Vitals:   04/19/20 1008 04/19/20 1012  BP:  (!) 127/58  Pulse: 93 83  Resp:  20  Temp:  98.3 F (36.8 C)  SpO2: 95% 92%    GENERAL: No apparent distress.  Nontoxic. HEENT: MMM.  Vision and hearing grossly intact.  NECK: Supple.  No apparent JVD.  RESP: On RA.  No IWOB.  Fair aeration bilaterally. CVS:  RRR. Heart sounds normal.  ABD/GI/GU: Bowel sounds present. Soft. Non tender.  MSK/EXT:  Moves extremities. No apparent deformity. No edema.  SKIN: no apparent skin lesion or wound NEURO: Awake, alert and oriented appropriately.  No apparent focal neuro deficit. PSYCH: Calm. Normal affect.  Discharge Instructions  Discharge Instructions    Diet - low sodium heart healthy   Complete by: As directed    Discharge instructions   Complete by: As directed    It has been a pleasure taking care of you!  You were hospitalized due to severe anemia which is due to bleeding from bladder mass.  We have treated you with blood transfusion.  Eventually, your blood levels remained stable and your bleeding subsided the point we think it is safe to let you go home and follow-up with your doctors.  Our oncologist and urologist will send a referral to local oncologist and urologist in [place] area.  We also recommend establishing care with a primary care doctor.   We have also started you on fluid medication for congestive heart failure. In addition to taking your medications as prescribed, we also recommend you avoid alcohol or over-the-counter pain medication other than plain Tylenol, limit the amount of water/fluid you drink to less than 6 cups (1500 cc) a day,  limit your sodium (salt) intake to  less than 2 g (2000 mg) a day and weigh yourself daily at the same time and keeping your weight log.     Take care,   Increase activity slowly   Complete by: As directed      Allergies as of 04/19/2020      Reactions   Other Other (See Comments)   Caramel       Medication List    TAKE these medications   busPIRone 7.5 MG tablet Commonly known as: BUSPAR Take 7.5 mg by mouth daily.   furosemide 20 MG tablet Commonly known as: LASIX Take 1 tablet (20 mg total) by mouth daily. Start taking on: April 20, 2020   lovastatin 10 MG tablet Commonly known as: MEVACOR Take 10 mg by mouth daily.   Vitamin D (Ergocalciferol) 1.25 MG (50000 UNIT) Caps capsule Commonly known as: DRISDOL Take 50,000 Units by mouth once a week.       Consultations:  Urology  Oncology  Palliative medicine  Procedures/Studies:  04/15/2020-cystoscopy and partial TURBT   US RENAL  Result Date: 04/15/2020 CLINICAL DATA:  Gross hematuria EXAM: RENAL / URINARY TRACT ULTRASOUND COMPLETE COMPARISON:  None. FINDINGS: Right Kidney: Renal measurements: 8.8 x 4.1 x 6.8 cm = volume: 128 mL. Echogenicity within normal limits.  No mass. Moderate hydronephrosis. Left Kidney: Renal measurements: 10.0 x 4.6 x 4.8 = volume: 117 mL. Echogenicity within normal limits. No mass or hydronephrosis visualized. Bladder: The collapsed around Foley catheter. Other: None IMPRESSION: Moderate right hydronephrosis indicative of ureteral obstruction which may be due to stone, stricture, or mass. Electronically Signed   By: Miachel Roux M.D.   On: 04/15/2020 07:59   DG Chest Port 1 View  Result Date: 04/15/2020 CLINICAL DATA:  Cough, weakness, lethargy EXAM: PORTABLE CHEST 1 VIEW COMPARISON:  None. FINDINGS: Cardiomegaly with mild vascular congestion and nonspecific slight interstitial prominence. Minor basilar atelectasis. No large effusion or pneumothorax. Bones are osteopenic. Atherosclerosis of the aorta. Scoliosis of the spine  noted. IMPRESSION: Cardiomegaly with vascular congestion and basilar atelectasis. Electronically Signed   By: Jerilynn Mages.  Shick M.D.   On: 04/15/2020 10:20   ECHOCARDIOGRAM COMPLETE  Result Date: 04/16/2020    ECHOCARDIOGRAM REPORT   Patient Name:   CLAUDETTE Ake Date of Exam: 04/16/2020 Medical Rec #:  [id number]       Height:       62.0 in Accession #:    [id number]      Weight:       132.7 lb Date of Birth:  [birth year]       BSA:          1.606 m Patient Age:    5 years        BP:           156/82 mmHg Patient Gender: F               HR:           86 bpm. Exam Location:  Inpatient Procedure: 2D Echo, Cardiac Doppler and Color Doppler Indications:    I50.23 Acute on chronic systolic (congestive) heart failure  History:        Patient has no prior history of Echocardiogram examinations.                 Risk Factors:Dyslipidemia.  Sonographer:    Tiffany Dance Referring Phys: [id number] Charlesetta Ivory Acxel Dingee IMPRESSIONS  1. Left ventricular ejection fraction, by estimation, is 60 to 65%. The left ventricle has normal function. The left ventricle has no regional wall motion abnormalities. There is mild left ventricular hypertrophy. Left ventricular diastolic parameters are consistent with Grade I diastolic dysfunction (impaired relaxation).  2. Right ventricular systolic function is normal. The right ventricular size is normal. There is mildly elevated pulmonary artery systolic pressure. The estimated right ventricular systolic pressure is 35.5 mmHg.  3. Left atrial size was moderately dilated.  4. Right atrial size was mildly dilated.  5. The mitral valve is normal in structure. Mild mitral valve regurgitation. No evidence of mitral stenosis.  6. The aortic valve is normal in structure. Aortic valve regurgitation is not visualized. No aortic stenosis is present.  7. The inferior vena cava is normal in size with greater than 50% respiratory variability, suggesting right atrial pressure of 3 mmHg. FINDINGS  Left Ventricle: Left  ventricular ejection fraction, by estimation, is 60 to 65%. The left ventricle has normal function. The left ventricle has no regional wall motion abnormalities. The left ventricular internal cavity size was normal in size. There is  mild left ventricular hypertrophy. Left ventricular diastolic parameters are consistent with Grade I diastolic dysfunction (impaired relaxation). Right Ventricle: The right ventricular size is normal. No increase in right ventricular wall thickness. Right ventricular systolic function is normal. There is mildly  elevated pulmonary artery systolic pressure. The tricuspid regurgitant velocity is 2.94  m/s, and with an assumed right atrial pressure of 8 mmHg, the estimated right ventricular systolic pressure is 44.8 mmHg. Left Atrium: Left atrial size was moderately dilated. Right Atrium: Right atrial size was mildly dilated. Pericardium: There is no evidence of pericardial effusion. Mitral Valve: The mitral valve is normal in structure. Mild mitral valve regurgitation. No evidence of mitral valve stenosis. Tricuspid Valve: The tricuspid valve is normal in structure. Tricuspid valve regurgitation is trivial. No evidence of tricuspid stenosis. Aortic Valve: The aortic valve is normal in structure. Aortic valve regurgitation is not visualized. No aortic stenosis is present. Pulmonic Valve: The pulmonic valve was normal in structure. Pulmonic valve regurgitation is not visualized. No evidence of pulmonic stenosis. Aorta: The aortic root is normal in size and structure. Venous: The inferior vena cava is normal in size with greater than 50% respiratory variability, suggesting right atrial pressure of 3 mmHg. IAS/Shunts: No atrial level shunt detected by color flow Doppler.  LEFT VENTRICLE PLAX 2D LVIDd:         3.70 cm  Diastology LVIDs:         2.50 cm  LV e' medial:    9.25 cm/s LV PW:         1.20 cm  LV E/e' medial:  8.8 LV IVS:        0.80 cm  LV e' lateral:   10.60 cm/s LVOT diam:     2.00  cm  LV E/e' lateral: 7.7 LV SV:         80 LV SV Index:   50 LVOT Area:     3.14 cm  RIGHT VENTRICLE             IVC RV Basal diam:  3.00 cm     IVC diam: 2.20 cm RV Mid diam:    1.70 cm RV S prime:     12.60 cm/s TAPSE (M-mode): 2.3 cm LEFT ATRIUM             Index       RIGHT ATRIUM           Index LA diam:        3.00 cm 1.87 cm/m  RA Area:     16.10 cm LA Vol (A2C):   75.0 ml 46.70 ml/m RA Volume:   45.10 ml  28.08 ml/m LA Vol (A4C):   68.8 ml 42.84 ml/m LA Biplane Vol: 72.6 ml 45.21 ml/m  AORTIC VALVE LVOT Vmax:   115.00 cm/s LVOT Vmean:  79.800 cm/s LVOT VTI:    0.254 m  AORTA Ao Root diam: 3.40 cm Ao Asc diam:  3.60 cm MITRAL VALVE                TRICUSPID VALVE MV Area (PHT): 2.95 cm     TR Peak grad:   34.6 mmHg MV Decel Time: 257 msec     TR Vmax:        294.00 cm/s MV E velocity: 81.20 cm/s MV A velocity: 107.00 cm/s  SHUNTS MV E/A ratio:  0.76         Systemic VTI:  0.25 m                             Systemic Diam: 2.00 cm Candee Furbish MD Electronically signed by Candee Furbish MD Signature Date/Time: 04/16/2020/1:11:02 PM    Final  The results of significant diagnostics from this hospitalization (including imaging, microbiology, ancillary and laboratory) are listed below for reference.     Microbiology: Recent Results (from the past 240 hour(s))  Culture, Urine     Status: None   Collection Time: 04/17/20  6:08 AM   Specimen: Urine, Random  Result Value Ref Range Status   Specimen Description   Final    URINE, RANDOM Performed at [facility] [address], [place], [place] [address]    Special Requests   Final    NONE Performed at [facility], [place] [address], [place], [place] [address]    Culture   Final    NO GROWTH Performed at [facility], [place] [address] [place],  [address]    Report Status 04/18/2020 FINAL  Final     Labs:  CBC: Recent Labs  Lab 04/15/20 0906 04/16/20 0311 04/16/20 1332  04/17/20 0307 04/18/20 0257 04/19/20 0350  WBC 15.8* 14.6*  --  13.3* 11.2* 9.7  NEUTROABS  --  12.9*  --   --   --   --   HGB 7.5* 6.8* 8.5* 8.2* 8.3* 8.0*  HCT 24.7* 22.8* 27.4* 27.0* 27.3* 26.7*  MCV 84.9 86.7  --  88.8 88.3 89.0  PLT 498* 422*  --  381 407* 380   BMP &GFR Recent Labs  Lab 04/15/20 0901 04/16/20 0311 04/17/20 0307 04/18/20 0257 04/19/20 0350  NA 140 138 140 138 140  K 4.2 4.6 4.0 3.4* 3.5  CL 112* 109 109 104 104  CO2 23 23 24 26 29   GLUCOSE 106* 139* 105* 106* 146*  BUN 23 25* 26* 23 17  CREATININE 1.31* 1.33* 1.28* 1.23* 1.17*  CALCIUM 8.0* 8.1* 8.3* 8.3* 8.4*  MG 2.0 1.9 1.9 1.8 1.9  PHOS  --   --  3.3 3.5 3.7   Estimated Creatinine Clearance: 30.6 mL/min (A) (by C-G formula based on SCr of 1.17 mg/dL (H)). Liver & Pancreas: Recent Labs  Lab 04/15/20 0319 04/15/20 0901 04/16/20 0311 04/17/20 0307 04/18/20 0257 04/19/20 0350  AST 214* 170* 88*  --  36 25  ALT 187* 171* 119*  --  51* 37  ALKPHOS 60 55 58  --  65 59  BILITOT 1.2 1.1 0.7  --  0.7 0.4  PROT 5.9* 5.5* 5.1*  --  5.1* 5.1*  ALBUMIN 2.8* 2.8* 2.4* 2.5* 2.4* 2.5*   No results for input(s): LIPASE, AMYLASE in the last 168 hours. No results for input(s): AMMONIA in the last 168 hours. Diabetic: No results for input(s): HGBA1C in the last 72 hours. No results for input(s): GLUCAP in the last 168 hours. Cardiac Enzymes: Recent Labs  Lab 04/15/20 0319 04/19/20 0350  CKTOTAL 348* 49   No results for input(s): PROBNP in the last 8760 hours. Coagulation Profile: Recent Labs  Lab 04/15/20 0319  INR 1.2   Thyroid Function Tests: No results for input(s): TSH, T4TOTAL, FREET4, T3FREE, THYROIDAB in the last 72 hours. Lipid Profile: No results for input(s): CHOL, HDL, LDLCALC, TRIG, CHOLHDL, LDLDIRECT in the last 72 hours. Anemia Panel: No results for input(s): VITAMINB12, FOLATE, FERRITIN, TIBC, IRON, RETICCTPCT in the last 72 hours. Urine analysis:    Component Value  Date/Time   COLORURINE RED (A) 04/17/2020 0608   APPEARANCEUR TURBID (A) 04/17/2020 0608   LABSPEC  04/17/2020 0608    TEST NOT REPORTED DUE TO COLOR INTERFERENCE OF URINE PIGMENT   PHURINE  04/17/2020 8676  TEST NOT REPORTED DUE TO COLOR INTERFERENCE OF URINE PIGMENT   GLUCOSEU (A) 04/17/2020 0608    TEST NOT REPORTED DUE TO COLOR INTERFERENCE OF URINE PIGMENT   HGBUR (A) 04/17/2020 0608    TEST NOT REPORTED DUE TO COLOR INTERFERENCE OF URINE PIGMENT   BILIRUBINUR (A) 04/17/2020 0608    TEST NOT REPORTED DUE TO COLOR INTERFERENCE OF URINE PIGMENT   KETONESUR (A) 04/17/2020 0608    TEST NOT REPORTED DUE TO COLOR INTERFERENCE OF URINE PIGMENT   PROTEINUR (A) 04/17/2020 0608    TEST NOT REPORTED DUE TO COLOR INTERFERENCE OF URINE PIGMENT   NITRITE (A) 04/17/2020 0608    TEST NOT REPORTED DUE TO COLOR INTERFERENCE OF URINE PIGMENT   LEUKOCYTESUR (A) 04/17/2020 0608    TEST NOT REPORTED DUE TO COLOR INTERFERENCE OF URINE PIGMENT   Sepsis Labs: Invalid input(s): PROCALCITONIN, LACTICIDVEN   Time coordinating discharge: 40 minutes  SIGNED:  Mercy Riding, MD  [facility] 04/19/2020, 12:06 PM  If 7PM-7AM, please contact night-coverage [email or web address]

## 2020-04-19 NOTE — Progress Notes (Signed)
Patient preparing for discharge at this time.  Patient ambulated in room to and from bathroom several times this morning.  O2 sat was obtained and found to be 95% on room air.  Patient had a foley catheter removed and has voided quantity sufficient X3 this morning.  Bladder scan showed zero residual following voiding.

## 2020-04-19 NOTE — Plan of Care (Signed)
Plan of care reviewed and discussed with the patient. 

## 2020-04-19 NOTE — Progress Notes (Signed)
Discharge instructions given with stated understanding 

## 2020-04-19 NOTE — Discharge Instructions (Signed)

## 2020-04-19 NOTE — TOC Transition Note (Signed)
Transition of Care [facility]) - CM/SW Discharge Note   Patient Details  Name: Sharon Huff MRN: [id number] Date of Birth: [birth year]  [facility] CM/SW Contact:  Trish Mage, LCSW Phone Number: 04/19/2020, 10:02 AM   Clinical Narrative:  Patient seen in follow up to PT/OT, MD order, for [facility] services.  [name]'s son was at bedside.  They asked for referral to [facility] in [place].  Phone [phone], [place]. Referral sent. No further needs identified.  TOC sign off.      Barriers to Discharge: No Barriers Identified   Patient Goals and CMS Choice        Discharge Placement                       Discharge Plan and Services                                     Social Determinants of Health (SDOH) Interventions     Readmission Risk Interventions No flowsheet data found.

## 2020-08-15 DEATH — deceased

## 2022-07-23 IMAGING — US US RENAL
1 series · 14 of 25 positions shown · non-contrast
Comparison: None.

CLINICAL DATA: Gross hematuria

EXAM:
RENAL / URINARY TRACT ULTRASOUND COMPLETE

[Series 1: us renal · 14 of 38 slices shown]
[im 1/38]
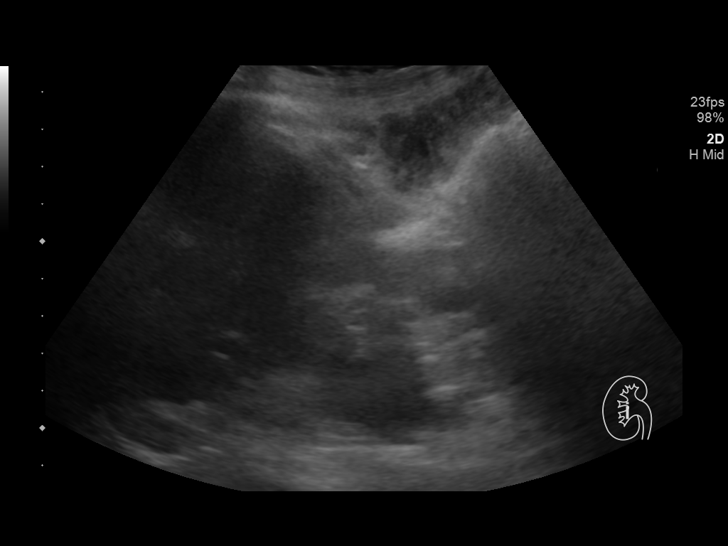
[im 4/38]
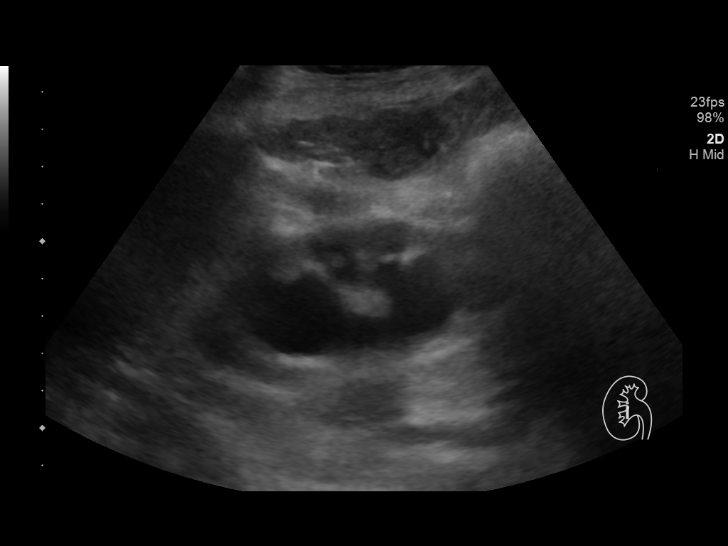
[im 7/38]
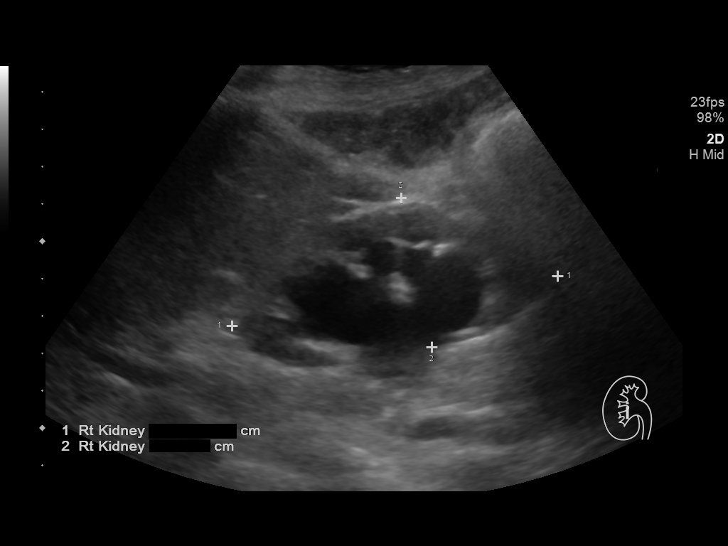
[im 10/38]
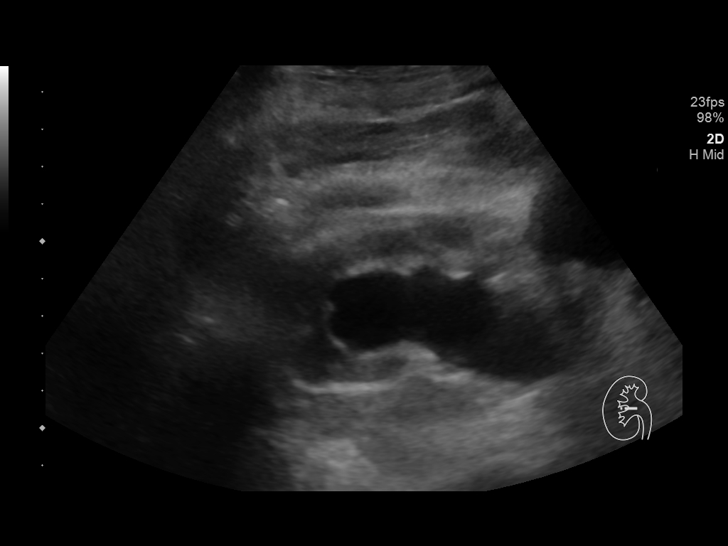
[im 13/38]
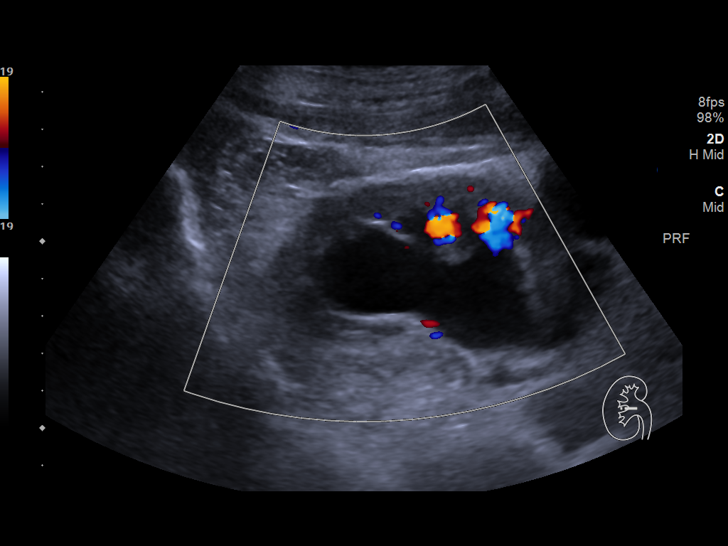
[im 14/38]
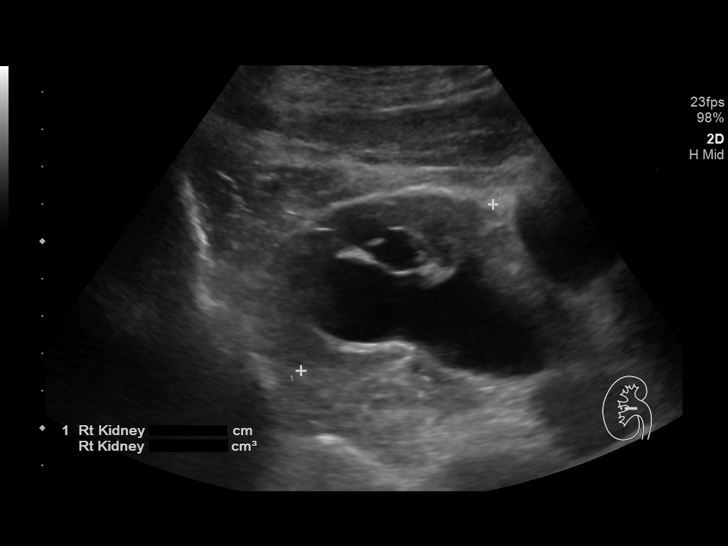
[im 17/38]
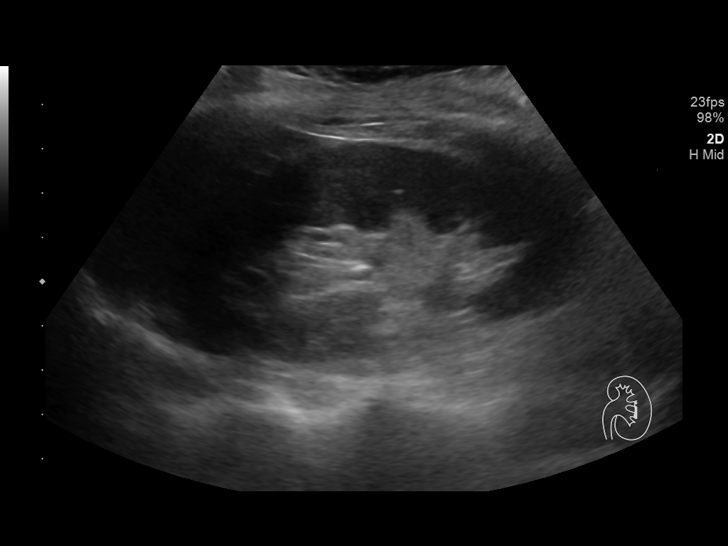
[im 21/38]
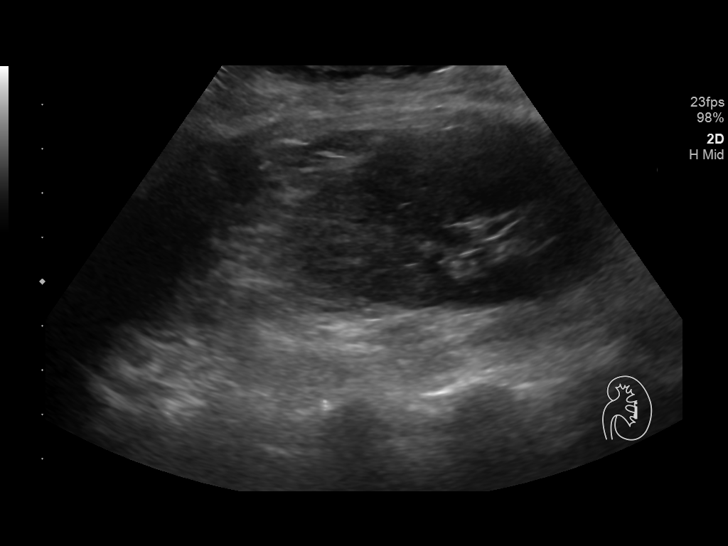
[im 24/38]
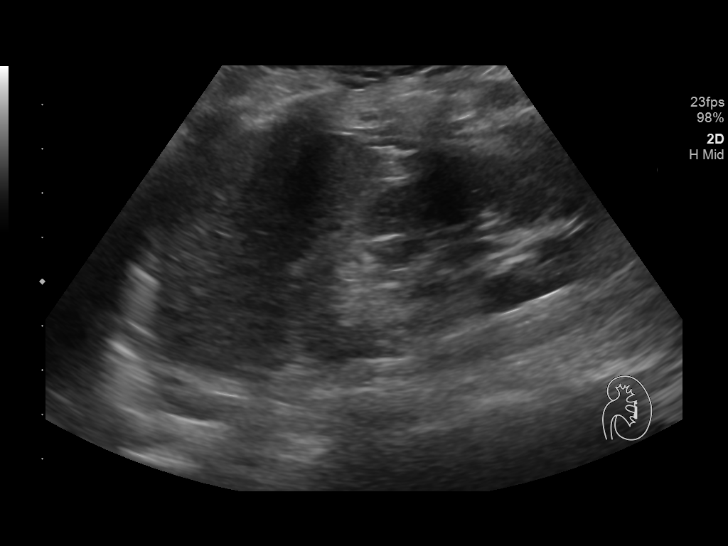
[im 25/38]
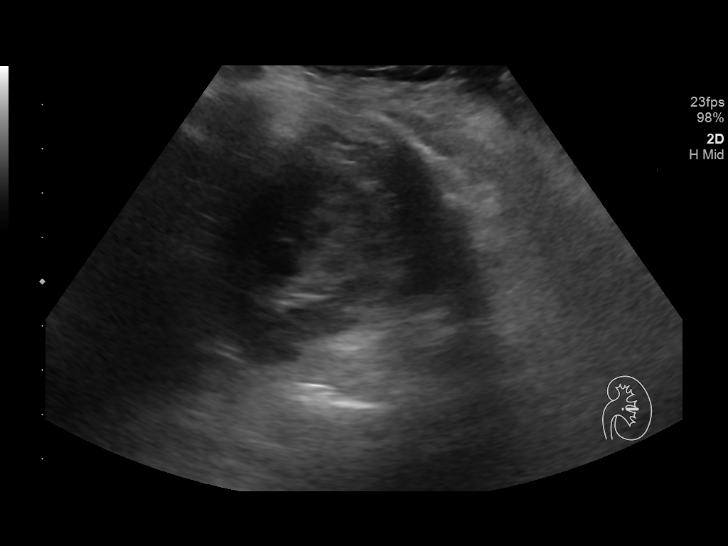
[im 28/38]
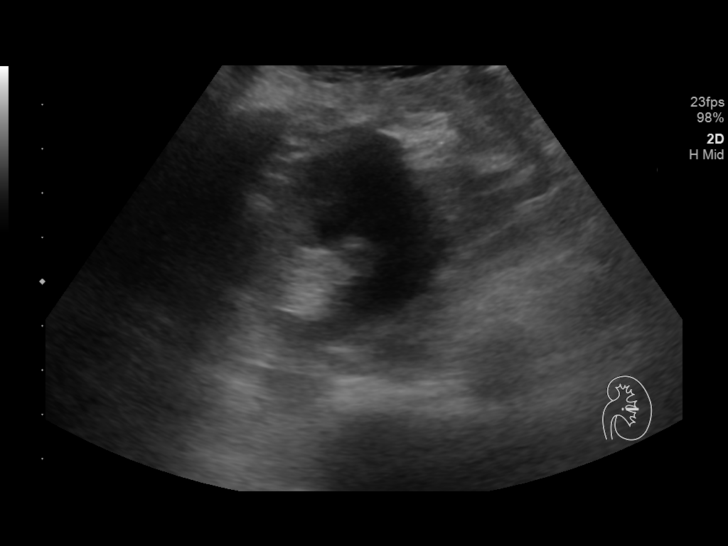
[im 31/38]
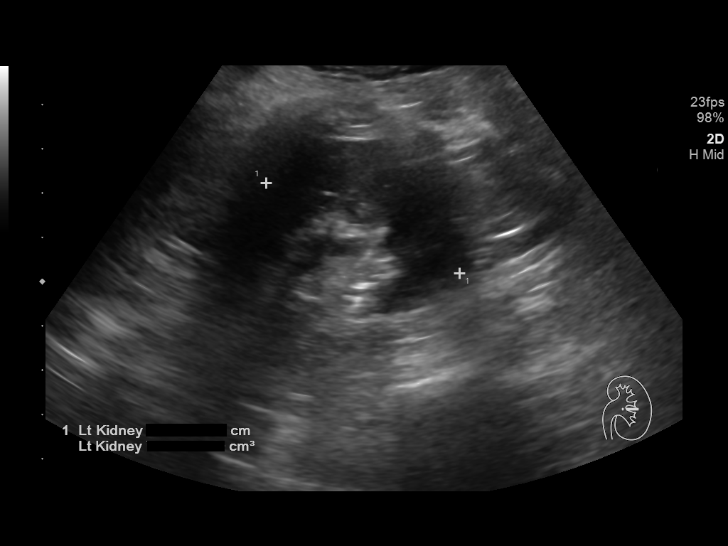
[im 34/38]
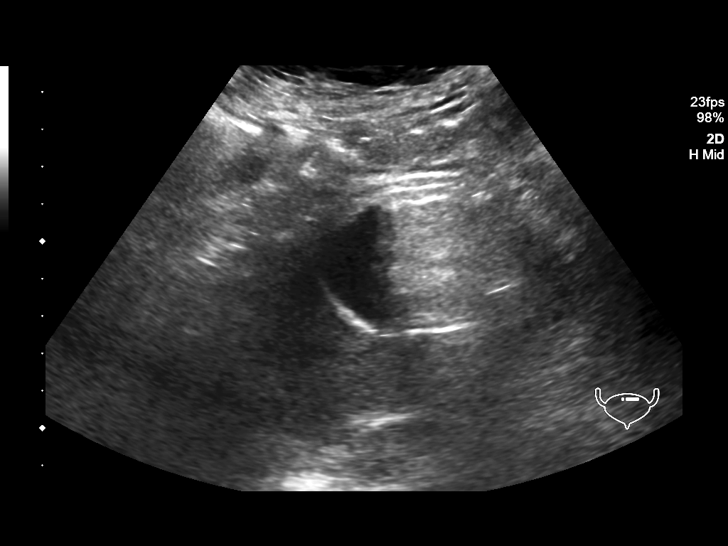
[im 38/38]
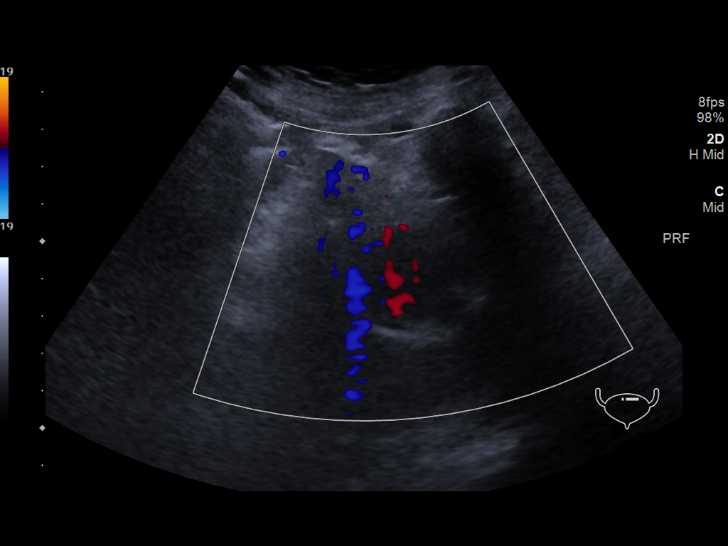

[14 of 25 positions shown; findings below may reference images not displayed]

FINDINGS: Right Kidney:

Renal measurements: 8.8 x 4.1 x 6.8 cm = volume: 128 mL.
Echogenicity within normal limits. No mass. Moderate hydronephrosis.

Left Kidney:

Renal measurements: 10.0 x 4.6 x 4.8 = volume: 117 mL. Echogenicity
within normal limits. No mass or hydronephrosis visualized.

Bladder:

The collapsed around Foley catheter.

Other:

None
IMPRESSION: Moderate right hydronephrosis indicative of ureteral obstruction
which may be due to stone, stricture, or mass.
